# Patient Record
Sex: Male | Born: 1955 | Race: White | Hispanic: No | Smoking: Current every day smoker
Health system: Southern US, Community
[De-identification: ages and names within clinical notes are randomized; demographics above are authoritative.]

## PROBLEM LIST (undated history)

## (undated) DIAGNOSIS — M1990 Unspecified osteoarthritis, unspecified site: Secondary | ICD-10-CM

## (undated) DIAGNOSIS — E785 Hyperlipidemia, unspecified: Secondary | ICD-10-CM

## (undated) HISTORY — PX: NECK SURGERY: SHX720

## (undated) HISTORY — PX: BACK SURGERY: SHX140

---

## 1998-10-22 ENCOUNTER — Ambulatory Visit (HOSPITAL_COMMUNITY): Admission: RE | Admit: 1998-10-22 | Discharge: 1998-10-22 | Payer: Self-pay | Admitting: Neurological Surgery

## 1998-10-22 ENCOUNTER — Encounter: Payer: Self-pay | Admitting: Neurological Surgery

## 1998-11-06 ENCOUNTER — Encounter: Payer: Self-pay | Admitting: Neurological Surgery

## 1998-11-10 ENCOUNTER — Inpatient Hospital Stay (HOSPITAL_COMMUNITY): Admission: RE | Admit: 1998-11-10 | Discharge: 1998-11-10 | Payer: Self-pay | Admitting: Neurological Surgery

## 1998-11-10 ENCOUNTER — Encounter: Payer: Self-pay | Admitting: Neurological Surgery

## 2000-11-23 ENCOUNTER — Ambulatory Visit (HOSPITAL_COMMUNITY): Admission: RE | Admit: 2000-11-23 | Discharge: 2000-11-23 | Payer: Self-pay | Admitting: Neurological Surgery

## 2000-12-14 ENCOUNTER — Ambulatory Visit (HOSPITAL_COMMUNITY): Admission: RE | Admit: 2000-12-14 | Discharge: 2000-12-14 | Payer: Self-pay | Admitting: Neurological Surgery

## 2006-11-02 ENCOUNTER — Ambulatory Visit: Payer: Self-pay | Admitting: Internal Medicine

## 2007-09-28 ENCOUNTER — Emergency Department: Payer: Self-pay | Admitting: Emergency Medicine

## 2009-06-16 ENCOUNTER — Ambulatory Visit: Payer: Self-pay | Admitting: Unknown Physician Specialty

## 2013-01-05 ENCOUNTER — Ambulatory Visit: Payer: Self-pay | Admitting: Internal Medicine

## 2013-05-26 ENCOUNTER — Emergency Department: Payer: Self-pay | Admitting: Emergency Medicine

## 2013-05-26 LAB — GC/CHLAMYDIA PROBE AMP

## 2013-05-26 LAB — URINALYSIS, COMPLETE
Bilirubin,UR: NEGATIVE
Glucose,UR: NEGATIVE mg/dL (ref 0–75)
Ketone: NEGATIVE
Nitrite: POSITIVE
Ph: 6 (ref 4.5–8.0)
Protein: 30
RBC,UR: 12 /HPF (ref 0–5)
Specific Gravity: 1.017 (ref 1.003–1.030)
Squamous Epithelial: NONE SEEN
WBC UR: 1190 /HPF (ref 0–5)

## 2013-05-26 LAB — COMPREHENSIVE METABOLIC PANEL
Albumin: 3.6 g/dL (ref 3.4–5.0)
Alkaline Phosphatase: 98 U/L (ref 50–136)
Anion Gap: 8 (ref 7–16)
BUN: 8 mg/dL (ref 7–18)
Bilirubin,Total: 0.8 mg/dL (ref 0.2–1.0)
Calcium, Total: 9 mg/dL (ref 8.5–10.1)
Chloride: 106 mmol/L (ref 98–107)
Co2: 24 mmol/L (ref 21–32)
Creatinine: 0.94 mg/dL (ref 0.60–1.30)
EGFR (African American): >60
EGFR (Non-African Amer.): >60
Glucose: 105 mg/dL — ABNORMAL HIGH (ref 65–99)
Osmolality: 274 (ref 275–301)
Potassium: 3.4 mmol/L — ABNORMAL LOW (ref 3.5–5.1)
SGOT(AST): 12 U/L — ABNORMAL LOW (ref 15–37)
SGPT (ALT): 12 U/L (ref 12–78)
Sodium: 138 mmol/L (ref 136–145)
Total Protein: 7 g/dL (ref 6.4–8.2)

## 2013-05-26 LAB — CBC
HCT: 36.6 % — ABNORMAL LOW (ref 40.0–52.0)
HGB: 12.9 g/dL — ABNORMAL LOW (ref 13.0–18.0)
MCH: 32.4 pg (ref 26.0–34.0)
MCHC: 35.3 g/dL (ref 32.0–36.0)
MCV: 92 fL (ref 80–100)
Platelet: 259 10*3/uL (ref 150–440)
RBC: 3.98 10*6/uL — ABNORMAL LOW (ref 4.40–5.90)
RDW: 13.4 % (ref 11.5–14.5)
WBC: 17.6 10*3/uL — ABNORMAL HIGH (ref 3.8–10.6)

## 2013-05-27 ENCOUNTER — Emergency Department: Payer: Self-pay | Admitting: Emergency Medicine

## 2013-05-27 LAB — CBC
HCT: 34.7 % — ABNORMAL LOW (ref 40.0–52.0)
HGB: 11.9 g/dL — ABNORMAL LOW (ref 13.0–18.0)
MCH: 32.3 pg (ref 26.0–34.0)
MCHC: 34.4 g/dL (ref 32.0–36.0)
Platelet: 210 10*3/uL (ref 150–440)
RDW: 13.4 % (ref 11.5–14.5)
WBC: 16.2 10*3/uL — ABNORMAL HIGH (ref 3.8–10.6)

## 2013-05-28 LAB — URINE CULTURE

## 2013-05-31 LAB — CULTURE, BLOOD (SINGLE)

## 2013-12-29 ENCOUNTER — Emergency Department: Payer: Self-pay | Admitting: Emergency Medicine

## 2015-12-03 ENCOUNTER — Other Ambulatory Visit: Payer: Self-pay | Admitting: Internal Medicine

## 2015-12-03 DIAGNOSIS — M5416 Radiculopathy, lumbar region: Secondary | ICD-10-CM

## 2015-12-24 ENCOUNTER — Ambulatory Visit
Admission: RE | Admit: 2015-12-24 | Discharge: 2015-12-24 | Disposition: A | Discharge status: Home / Self Care | Payer: Medicare Other | Source: Ambulatory Visit | Attending: Internal Medicine | Admitting: Internal Medicine

## 2015-12-24 DIAGNOSIS — Z9889 Other specified postprocedural states: Secondary | ICD-10-CM | POA: Diagnosis not present

## 2015-12-24 DIAGNOSIS — R937 Abnormal findings on diagnostic imaging of other parts of musculoskeletal system: Secondary | ICD-10-CM | POA: Diagnosis not present

## 2015-12-24 DIAGNOSIS — M5416 Radiculopathy, lumbar region: Secondary | ICD-10-CM | POA: Diagnosis not present

## 2016-01-07 ENCOUNTER — Emergency Department
Admission: EM | Admit: 2016-01-07 | Discharge: 2016-01-07 | Disposition: A | Discharge status: Home / Self Care | Payer: Medicare Other | Attending: Emergency Medicine | Admitting: Emergency Medicine

## 2016-01-07 ENCOUNTER — Encounter: Payer: Self-pay | Admitting: *Deleted

## 2016-01-07 DIAGNOSIS — Y999 Unspecified external cause status: Secondary | ICD-10-CM | POA: Diagnosis not present

## 2016-01-07 DIAGNOSIS — Z4800 Encounter for change or removal of nonsurgical wound dressing: Secondary | ICD-10-CM | POA: Diagnosis present

## 2016-01-07 DIAGNOSIS — F17210 Nicotine dependence, cigarettes, uncomplicated: Secondary | ICD-10-CM | POA: Insufficient documentation

## 2016-01-07 DIAGNOSIS — Y929 Unspecified place or not applicable: Secondary | ICD-10-CM | POA: Insufficient documentation

## 2016-01-07 DIAGNOSIS — W228XXA Striking against or struck by other objects, initial encounter: Secondary | ICD-10-CM | POA: Diagnosis not present

## 2016-01-07 DIAGNOSIS — M1990 Unspecified osteoarthritis, unspecified site: Secondary | ICD-10-CM | POA: Insufficient documentation

## 2016-01-07 DIAGNOSIS — Y9389 Activity, other specified: Secondary | ICD-10-CM | POA: Insufficient documentation

## 2016-01-07 DIAGNOSIS — S61411A Laceration without foreign body of right hand, initial encounter: Secondary | ICD-10-CM | POA: Diagnosis not present

## 2016-01-07 HISTORY — DX: Unspecified osteoarthritis, unspecified site: M19.90
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
## 2016-01-07 NOTE — Discharge Instructions (Signed)
Leave dressing on for 1 week. Keep area clean and dry. Tylenol if needed for pain. Follow-up with your primary care doctor if any continued problems.

## 2016-01-07 NOTE — ED Notes (Signed)
Pt has  A skin tear on right hand and would like wound checked
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
## 2016-05-27 ENCOUNTER — Emergency Department: Payer: Medicare Other

## 2016-05-27 ENCOUNTER — Emergency Department
Admission: EM | Admit: 2016-05-27 | Discharge: 2016-05-27 | Disposition: A | Discharge status: Home / Self Care | Payer: Medicare Other | Attending: Emergency Medicine | Admitting: Emergency Medicine

## 2016-05-27 DIAGNOSIS — S161XXA Strain of muscle, fascia and tendon at neck level, initial encounter: Secondary | ICD-10-CM | POA: Insufficient documentation

## 2016-05-27 DIAGNOSIS — S20211A Contusion of right front wall of thorax, initial encounter: Secondary | ICD-10-CM | POA: Insufficient documentation

## 2016-05-27 DIAGNOSIS — Y9389 Activity, other specified: Secondary | ICD-10-CM | POA: Insufficient documentation

## 2016-05-27 DIAGNOSIS — F17210 Nicotine dependence, cigarettes, uncomplicated: Secondary | ICD-10-CM | POA: Diagnosis not present

## 2016-05-27 DIAGNOSIS — Y999 Unspecified external cause status: Secondary | ICD-10-CM | POA: Insufficient documentation

## 2016-05-27 DIAGNOSIS — S39012A Strain of muscle, fascia and tendon of lower back, initial encounter: Secondary | ICD-10-CM | POA: Diagnosis not present

## 2016-05-27 DIAGNOSIS — S199XXA Unspecified injury of neck, initial encounter: Secondary | ICD-10-CM | POA: Diagnosis present

## 2016-05-27 DIAGNOSIS — Y92410 Unspecified street and highway as the place of occurrence of the external cause: Secondary | ICD-10-CM | POA: Diagnosis not present

## 2016-05-27 MED ORDER — OXYCODONE-ACETAMINOPHEN 5-325 MG PO TABS
1.0000 | ORAL_TABLET | Freq: Once | ORAL | Status: AC
  Administered 2016-05-27: 1 via ORAL
  Filled 2016-05-27: qty 1

## 2016-05-27 MED ORDER — ETODOLAC 500 MG PO TABS: 500.0000 mg | ORAL_TABLET | Freq: Two times a day (BID) | ORAL | 0 refills | Status: DC

## 2016-05-27 MED ORDER — CYCLOBENZAPRINE HCL 10 MG PO TABS: 10.0000 mg | ORAL_TABLET | Freq: Three times a day (TID) | ORAL | 0 refills | Status: DC | PRN
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
## 2016-05-27 NOTE — Discharge Instructions (Signed)
Take pain medicine as directed. Follow-up with your physician if not improving. Return to the emergency room for any concerns.

## 2016-06-03 ENCOUNTER — Emergency Department: Payer: Medicare Other

## 2016-06-03 ENCOUNTER — Emergency Department
Admission: EM | Admit: 2016-06-03 | Discharge: 2016-06-03 | Disposition: A | Discharge status: Home / Self Care | Payer: Medicare Other | Attending: Emergency Medicine | Admitting: Emergency Medicine

## 2016-06-03 DIAGNOSIS — S2241XA Multiple fractures of ribs, right side, initial encounter for closed fracture: Secondary | ICD-10-CM | POA: Diagnosis not present

## 2016-06-03 DIAGNOSIS — Y92410 Unspecified street and highway as the place of occurrence of the external cause: Secondary | ICD-10-CM | POA: Diagnosis not present

## 2016-06-03 DIAGNOSIS — F17210 Nicotine dependence, cigarettes, uncomplicated: Secondary | ICD-10-CM | POA: Insufficient documentation

## 2016-06-03 DIAGNOSIS — Y9389 Activity, other specified: Secondary | ICD-10-CM | POA: Diagnosis not present

## 2016-06-03 DIAGNOSIS — S2231XA Fracture of one rib, right side, initial encounter for closed fracture: Secondary | ICD-10-CM

## 2016-06-03 DIAGNOSIS — R0602 Shortness of breath: Secondary | ICD-10-CM | POA: Diagnosis present

## 2016-06-03 DIAGNOSIS — Y999 Unspecified external cause status: Secondary | ICD-10-CM | POA: Diagnosis not present

## 2016-06-03 HISTORY — DX: Hyperlipidemia, unspecified: E78.5

## 2016-06-03 LAB — CBC
HCT: 36.7 % — ABNORMAL LOW (ref 40.0–52.0)
Hemoglobin: 13 g/dL (ref 13.0–18.0)
MCH: 35.3 pg — ABNORMAL HIGH (ref 26.0–34.0)
MCHC: 35.3 g/dL (ref 32.0–36.0)
MCV: 100.1 fL — AB (ref 80.0–100.0)
PLATELETS: 313 10*3/uL (ref 150–440)
RBC: 3.67 MIL/uL — AB (ref 4.40–5.90)
RDW: 14.6 % — AB (ref 11.5–14.5)
WBC: 9.2 10*3/uL (ref 3.8–10.6)

## 2016-06-03 LAB — BASIC METABOLIC PANEL
ANION GAP: 7 (ref 5–15)
CO2: 28 mmol/L (ref 22–32)
Calcium: 9.6 mg/dL (ref 8.9–10.3)
Chloride: 100 mmol/L — ABNORMAL LOW (ref 101–111)
Creatinine, Ser: 0.78 mg/dL (ref 0.61–1.24)
Glucose, Bld: 83 mg/dL (ref 65–99)
POTASSIUM: 3.6 mmol/L (ref 3.5–5.1)
SODIUM: 135 mmol/L (ref 135–145)

## 2016-06-03 LAB — TROPONIN I

## 2016-06-03 MED ORDER — ONDANSETRON HCL 4 MG/2ML IJ SOLN
4.0000 mg | Freq: Once | INTRAMUSCULAR | Status: AC
  Administered 2016-06-03: 4 mg via INTRAVENOUS
  Filled 2016-06-03: qty 2

## 2016-06-03 MED ORDER — MORPHINE SULFATE (PF) 4 MG/ML IV SOLN
4.0000 mg | Freq: Once | INTRAVENOUS | Status: AC
  Administered 2016-06-03: 4 mg via INTRAVENOUS
  Filled 2016-06-03: qty 1

## 2016-06-03 MED ORDER — IOPAMIDOL (ISOVUE-370) INJECTION 76%
125.0000 mL | Freq: Once | INTRAVENOUS | Status: AC | PRN
  Administered 2016-06-03: 125 mL via INTRAVENOUS

## 2016-06-03 MED ORDER — SODIUM CHLORIDE 0.9 % IV BOLUS (SEPSIS)
1000.0000 mL | Freq: Once | INTRAVENOUS | Status: AC
  Administered 2016-06-03: 1000 mL via INTRAVENOUS

## 2016-06-03 NOTE — Discharge Instructions (Signed)
1. Continue pain medicine as prescribed by your doctor. 2. Use incentive spirometer as instructed. 3. Return to the ER for worsening symptoms, fever, persistent vomiting, difficulty breathing or other concerns.

## 2016-06-03 NOTE — ED Notes (Signed)
Patient transported to CT 

## 2016-06-03 NOTE — ED Notes (Signed)
Pt discharged to home.  Family member driving.  Discharge instructions reviewed.  Verbalized understanding.  No questions or concerns at this time.  Teach back verified.  Pt in NAD.  No items left in ED.   
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
## 2019-08-28 ENCOUNTER — Encounter: Payer: Self-pay | Admitting: Student in an Organized Health Care Education/Training Program

## 2019-08-28 NOTE — Progress Notes (Signed)
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 patient will need baseline urine  toxicology screen and referral to pain psychiatry for risk evaluation which is customary for new patients.  In the meantime opioid management to continue with PCP.  5.  Patient has tried and failed gabapentin in the past resulting in sedation.  Has tried various NSAIDs in the past including ibuprofen and Aleve which he states were not effective and caused GI upset.  He has tried extra strength Tylenol in the past as well.  He utilizes hydrocodone 2 tablets every 8 hours which he states provides suboptimal pain relief.  We had extensive discussion about opioid-induced hyperalgesia and tolerance.  We also discussed how chronic opioid therapy can decrease testosterone levels.  Upon my assessment, no red flags the patient's past medical history to suggest high risk.  Could  consider alternatives such as buprenorphine therapy.  Patient states that opioid medications do help with his pain to a certain extent and help him function.     Lab Orders     UDS (Comprehensive-24) ([facility]) ([facility]) (New Pt.)  Imaging Orders     MRI C-Spine w/o contrast     MR Lumbar Spine w/o contrast  Referral Orders     SUD Evaluation (Med.Psych.) Pharmacotherapy (current): Medications ordered:  Meds ordered this encounter  Medications  . pregabalin (LYRICA) 50 MG capsule    Sig: Take 1 capsule (50 mg total) by mouth at bedtime for 30 days, THEN 1 capsule (50 mg total) 2 (two) times daily.    Dispense:  150 capsule    Refill:  0   Medications administered during this visit: Thaniel Florestine Martin had no medications administered during this visit.   Pharmacological management options:  Opioid Analgesics: The patient was informed that there is no guarantee that he would be a candidate for opioid analgesics. The decision will be made following CDC guidelines. This decision will be based on the results of diagnostic studies, as well as Mr. Berenson risk profile.   Membrane stabilizer: Lyrica as above. Tried and failed Gabapentin (sedation). Consider cymbalta  Muscle relaxant: To be determined at a later time  NSAID: To be determined at a later time  Other analgesic(s): To be determined at a later time   Interventional management options: Mr. Stammer was informed that there is no guarantee that he would be a candidate for interventional therapies. The decision will be based on the results of diagnostic studies, as well as Mr. Marinello risk profile.  Procedure(s) under consideration:  Pending C-MRI and L-MRI   Provider-requested follow-up: Return in about 6 weeks (around 10/10/2019) for After Imaging, After Psychological evaluation.  No future appointments.  Total duration of non-face-to-face encounter: 45 minutes.  Primary Care Physician: Rusty Aus, MD Location: [facility] Note by: Gillis Santa, MD Date: 08/29/2019; Time: 1:36 PM  Note: This dictation was prepared with Dragon dictation. Any transcriptional errors that may result from this process are unintentional.

## 2019-08-28 NOTE — Progress Notes (Signed)
New patient coming for chronic back pain.  Has had a neck surgery and back surgery that helped initially.  States that his back bones are crumbling. Diables since 2016

## 2019-08-29 ENCOUNTER — Ambulatory Visit
Payer: Medicare Other | Attending: Student in an Organized Health Care Education/Training Program | Admitting: Student in an Organized Health Care Education/Training Program

## 2019-08-29 ENCOUNTER — Other Ambulatory Visit: Payer: Self-pay

## 2019-08-29 ENCOUNTER — Encounter: Payer: Self-pay | Admitting: Student in an Organized Health Care Education/Training Program

## 2019-08-29 DIAGNOSIS — M5412 Radiculopathy, cervical region: Secondary | ICD-10-CM

## 2019-08-29 DIAGNOSIS — Z79891 Long term (current) use of opiate analgesic: Secondary | ICD-10-CM

## 2019-08-29 DIAGNOSIS — Z981 Arthrodesis status: Secondary | ICD-10-CM | POA: Diagnosis not present

## 2019-08-29 DIAGNOSIS — M542 Cervicalgia: Secondary | ICD-10-CM

## 2019-08-29 DIAGNOSIS — G894 Chronic pain syndrome: Secondary | ICD-10-CM

## 2019-08-29 DIAGNOSIS — F1120 Opioid dependence, uncomplicated: Secondary | ICD-10-CM

## 2019-08-29 DIAGNOSIS — M5416 Radiculopathy, lumbar region: Secondary | ICD-10-CM

## 2019-08-29 DIAGNOSIS — G8929 Other chronic pain: Secondary | ICD-10-CM

## 2019-08-29 DIAGNOSIS — M47812 Spondylosis without myelopathy or radiculopathy, cervical region: Secondary | ICD-10-CM | POA: Diagnosis not present

## 2019-08-29 DIAGNOSIS — M5136 Other intervertebral disc degeneration, lumbar region: Secondary | ICD-10-CM

## 2019-08-29 DIAGNOSIS — M51369 Other intervertebral disc degeneration, lumbar region without mention of lumbar back pain or lower extremity pain: Secondary | ICD-10-CM | POA: Insufficient documentation

## 2019-08-29 MED ORDER — PREGABALIN 50 MG PO CAPS: ORAL_CAPSULE | ORAL | 0 refills | Status: DC

## 2019-09-07 LAB — COMPLIANCE DRUG ANALYSIS, UR

## 2019-09-12 ENCOUNTER — Ambulatory Visit: Payer: Medicare Other | Admitting: Student in an Organized Health Care Education/Training Program

## 2019-09-17 ENCOUNTER — Other Ambulatory Visit: Payer: Self-pay

## 2019-09-17 ENCOUNTER — Ambulatory Visit
Admission: RE | Admit: 2019-09-17 | Discharge: 2019-09-17 | Disposition: A | Discharge status: Home / Self Care | Payer: Medicare Other | Source: Ambulatory Visit | Attending: Student in an Organized Health Care Education/Training Program | Admitting: Student in an Organized Health Care Education/Training Program

## 2019-09-17 DIAGNOSIS — Z981 Arthrodesis status: Secondary | ICD-10-CM | POA: Insufficient documentation

## 2019-09-17 DIAGNOSIS — M5416 Radiculopathy, lumbar region: Secondary | ICD-10-CM | POA: Insufficient documentation

## 2019-09-17 DIAGNOSIS — M5412 Radiculopathy, cervical region: Secondary | ICD-10-CM | POA: Insufficient documentation

## 2019-09-24 ENCOUNTER — Telehealth: Payer: Self-pay

## 2019-09-24 NOTE — Telephone Encounter (Signed)
Attempted to call patient .  Message came on that states that the patient can not be reached at this time.

## 2019-09-25 ENCOUNTER — Encounter: Payer: Self-pay | Admitting: Student in an Organized Health Care Education/Training Program

## 2019-09-25 NOTE — Progress Notes (Signed)
Today's visit is to review MRI results for back. Patient was started on Lyrica last appointment and states he had to stop taking it after 12 days. It made him constipated and very evil. He weaned off his medication.

## 2019-09-28 ENCOUNTER — Encounter

## 2019-09-28 ENCOUNTER — Ambulatory Visit (HOSPITAL_COMMUNITY): Payer: Medicaid Other | Admitting: Psychiatry

## 2019-09-30 ENCOUNTER — Encounter: Payer: Self-pay | Admitting: Student in an Organized Health Care Education/Training Program

## 2019-09-30 ENCOUNTER — Other Ambulatory Visit: Payer: Self-pay

## 2019-09-30 ENCOUNTER — Ambulatory Visit
Payer: Medicare Other | Attending: Student in an Organized Health Care Education/Training Program | Admitting: Student in an Organized Health Care Education/Training Program

## 2019-09-30 DIAGNOSIS — G894 Chronic pain syndrome: Secondary | ICD-10-CM

## 2019-09-30 DIAGNOSIS — Z79891 Long term (current) use of opiate analgesic: Secondary | ICD-10-CM

## 2019-09-30 DIAGNOSIS — G8929 Other chronic pain: Secondary | ICD-10-CM

## 2019-09-30 DIAGNOSIS — Z9889 Other specified postprocedural states: Secondary | ICD-10-CM

## 2019-09-30 DIAGNOSIS — M5412 Radiculopathy, cervical region: Secondary | ICD-10-CM

## 2019-09-30 DIAGNOSIS — M5416 Radiculopathy, lumbar region: Secondary | ICD-10-CM | POA: Diagnosis not present

## 2019-09-30 DIAGNOSIS — M47812 Spondylosis without myelopathy or radiculopathy, cervical region: Secondary | ICD-10-CM

## 2019-09-30 DIAGNOSIS — M47816 Spondylosis without myelopathy or radiculopathy, lumbar region: Secondary | ICD-10-CM

## 2019-09-30 DIAGNOSIS — Z981 Arthrodesis status: Secondary | ICD-10-CM

## 2019-09-30 DIAGNOSIS — F1120 Opioid dependence, uncomplicated: Secondary | ICD-10-CM

## 2019-09-30 MED ORDER — BUPRENORPHINE 7.5 MCG/HR TD PTWK: 7.5000 ug/h | MEDICATED_PATCH | TRANSDERMAL | 0 refills | Status: AC

## 2019-09-30 NOTE — Progress Notes (Signed)
Virtual Encounter - Pain Management     Contact & Pharmacy Preferred: [phone] Home: [phone] (home) Mobile: [phone] (mobile) E-mail: [email or web address]  [facility] [address] [place], [place] - [address] [place] [address] Phone: [phone] Fax: [phone]  [id number] [place], [place] - [address] AT [facility] OF [address] [place] [address] [place] [address] Phone: [phone] Fax: [phone]   Pre-screening  Mr. Stash offered "in-person" vs "virtual" encounter. He indicated preferring virtual for this encounter.   Reason COVID-19*  Social distancing based on CDC and AMA recommendations.   I contacted Auron Vaughan Sine on 09/30/2019 via telephone (attempted video conference but patient unable to connect).      I clearly identified myself as Edward Jolly, MD. I verified that I was speaking with the correct person using two identifiers (Name: Suzie Portela, and date of birth: 25-Jul-1956).  Consent I sought verbal advanced consent from Suzie Portela for virtual visit interactions. I informed Mr. Ganser of possible security and privacy concerns, risks, and limitations associated with providing "not-in-person" medical evaluation and management services. I also informed Mr. Blasius of the availability of "in-person" appointments. Finally, I informed him that there would be a charge for the virtual visit and that he could be  personally, fully or partially, financially responsible for it. Mr. Eisner expressed understanding and agreed to proceed.   Pt location: home address (see Epic) Provider location: office  Historic Elements   Mr. ASAD KEEVEN is a 64 y.o. year old, male patient evaluated today after his last encounter by our practice on 09/24/2019. Mr. Thackston  has a past medical history of Arthritis and Hyperlipidemia. He also  has a past surgical history that includes Neck surgery and Back surgery. Mr. Nielson has a  current medication list which includes the following prescription(s): acetaminophen, aspirin-acetaminophen-caffeine, vitamin b12, folic acid, hydrocodone-acetaminophen, simvastatin, and buprenorphine. He  reports that he has been smoking cigarettes. He has a 15.00 pack-year smoking history. He has never used smokeless tobacco. He reports that he does not drink alcohol. No history on file for drug. Mr. Coll is allergic to lyrica [pregabalin]; penicillins; and prednisone.   HPI  Today, he is being contacted for review MRI and discuss treatment plan   Tried Lyrica did not sit well with patient, had difficulty controlling temper. Weaned himself off. L-MRI and C-MRI completed with results below. Was scheduled to have appointment with Psych on 1/2 but was rescheduled Goals of pain management include improvement in quality of life and maintenance of functional status without escalation of opioid medications. Patient is prescribed hydrocodone 7.5 mg quantity 180 and usually takes 2 tablets at once 3 times a day.  He states that his current prescription usually lasts about 2-2 and half weeks.  Pharmacotherapy Assessment  Analgesic:  09/12/2019  1   09/12/2019  Hydrocodone-Acetamin 7.5-325  180.00  30 Ma Mil   [id number]   Wal ([id number])   0  45.00 MME  Medicare   [place]    Monitoring: Pharmacotherapy: No side-effects or adverse reactions reported. [place] PMP: PDMP reviewed during this encounter.       Compliance: No problems identified. Effectiveness: patient likely experiencing opioid tolerance and would recommend opioid rotation and or holiday.  Patient is more interested in opioid rotation.  Discussed long-acting therapy as below. Plan: Refer to "POC".  UDS:  Summary  Date Value Ref Range Status  09/02/2019  Note  Final    Comment:    ==================================================================== Compliance Drug Analysis, Ur ==================================================================== Test                              Result       Flag       Units Drug Present and Declared for Prescription Verification   Pregabalin                     PRESENT      EXPECTED   Acetaminophen                  PRESENT      EXPECTED Drug Absent but Declared for Prescription Verification   Hydrocodone                    Not Detected UNEXPECTED ng/mg creat ==================================================================== Test                      Result    Flag   Units      Ref Range   Creatinine              59               mg/dL      >=30>=20 ==================================================================== Declared Medications:  The flagging and interpretation on this report are based on the  following declared medications.  Unexpected results may arise from  inaccuracies in the declared medications.  **Note: The testing scope of this panel includes these medications:  Hydrocodone  Pregabalin  **Note: The testing scope of this panel does not include small to  moderate amounts of these reported medications:  Acetaminophen  **Note: The testing scope of this panel does not include the  following reported medications:  Folic Acid  Simvastatin  Vitamin B12 ==================================================================== For clinical consultation, please call [phone]. ====================================================================    Laboratory Chemistry Profile (12 mo)  Renal: No results found for requested labs within last 8760 hours.  Lab Results  Component Value Date   GFRAA >60 06/03/2016   GFRNONAA >60 06/03/2016   Hepatic: No results found for requested labs within last 8760 hours. Lab Results  Component Value Date   AST 12 (L) 05/26/2013   ALT 12 05/26/2013   Other: No results found for requested labs within last 8760 hours. Note: Above Lab results reviewed.  Imaging  MRI C-Spine w/o contrast CLINICAL DATA:  Neck pain and radiculitis.  Cervical spine  fusion.  EXAM: MRI CERVICAL SPINE WITHOUT CONTRAST  TECHNIQUE: Multiplanar, multisequence MR imaging of the cervical spine was performed. No intravenous contrast was administered.  COMPARISON:  MRI cervical spine 01/05/2013  FINDINGS: Alignment: 3 mm retrolisthesis C3-4 is progressed. 2 mm retrolisthesis C4-5 unchanged. Remaining alignment normal.  Vertebrae: ACDF C5-6 and C6-7.  Negative for fracture or mass.  Cord: Normal signal and morphology.  Posterior Fossa, vertebral arteries, paraspinal tissues: Negative  Disc levels:  C2-3: Mild facet degeneration bilaterally. Negative for stenosis  C3-4: 3 mm retrolisthesis with disc degeneration and spurring. Cord flattening with mild to moderate spinal stenosis which has progressed. Moderate foraminal stenosis bilaterally due to spurring  C4-5: 2 mm retrolisthesis. Disc degeneration and diffuse uncinate spurring. Moderate spinal stenosis and moderate foraminal stenosis bilaterally due to spurring. Stenosis has progressed in the interval.  C5-6: ACDF without stenosis.  C6-7: ACDF without stenosis  C7-T1: Small central disc protrusion. Negative for  spinal or foraminal stenosis. No change from the prior study.  IMPRESSION: Progressive spondylosis C3-4 and C4-5. Progressive spinal and foraminal stenosis at both levels compared with 2014  ACDF C5-6 and C6-7 without stenosis.  Electronically Signed   By: Franchot Gallo M.D.   On: 09/17/2019 13:46 MR Lumbar Spine w/o contrast CLINICAL DATA:  Lumbar radiculopathy.  EXAM: MRI LUMBAR SPINE WITHOUT CONTRAST  TECHNIQUE: Multiplanar, multisequence MR imaging of the lumbar spine was performed. No intravenous contrast was administered.  COMPARISON:  Lumbar MRI 12/24/2015  FINDINGS: Segmentation: Normal segmentation. S1 is a transitional vertebra. Prior surgery on the left at L5-S1. Numbering is consistent with the prior MRI.  Alignment:  Normal  Vertebrae: Negative  for fracture or mass. Normal bone marrow. Discogenic edema on the left at L5-S1.  Conus medullaris and cauda equina: Conus extends to the L1 level. Conus and cauda equina appear normal.  Paraspinal and other soft tissues: Negative for paraspinous mass or adenopathy.  Disc levels:  T12-L1: Small right paracentral disc protrusion, unchanged.  L1-2: Mild disc and mild facet degeneration.  Negative for stenosis  L2-3: Mild disc and mild facet degeneration.  Negative for stenosis  L3-4: Disc bulging and facet degeneration.  No significant stenosis  L4-5: Mild disc and facet degeneration.  Negative for stenosis  L5-S1: Postop laminectomy on the left. Advanced disc degeneration with disc space narrowing and spurring left greater than right. Discogenic edema in the endplates on the left. Mild endplate spurring on the left causing mild subarticular stenosis on the left. No change from the prior study.  IMPRESSION: Prior laminectomy on the left L5-S1. Persistent disc degeneration and scarring on the left with mild subarticular stenosis, similar to 2017. No acute disc protrusion  Mild degenerative changes elsewhere in the spine without spinal stenosis.  Electronically Signed   By: Franchot Gallo M.D.   On: 09/17/2019 13:42   Assessment   1. Cervical facet joint syndrome   2. Cervical spondylosis   3. Cervical radicular pain   4. Hx of decompressive lumbar laminectomy (left L5/S1)   5. S/P cervical spinal fusion (c5-7 1998)   6. Chronic radicular lumbar pain   7. Lumbar spondylosis   8. Lumbar facet arthropathy   9. Long term prescription opiate use   10. Opiate dependence, continuous (HCC)   11. Chronic pain syndrome     Plan of Care   Spent an extensive period of time reviewing the patient's cervical and lumbar spine MRI.  In regards to the patient's neck and bilateral shoulder pain, he does have a history of C4-C5 and C5-C6 ACDF.  Cervical MRI shows cervical  degenerative disease and cervical spondylosis.  We discussed diagnostic cervical facet medial branch nerve blocks at C3, C4, C5, C6.  Risks and benefits of this procedure were reviewed.  We also briefly discussed therapeutic radiofrequency ablation diagnostic facet medial branch nerve blocks are effective.  Patient states that he will think about this and let us know if he wants to proceed.  Also reviewed the patient's lumbar spine MRI.  Largely unchanged from his previous lumbar spine MRI other than advanced lumbar degenerative changes.  No significant spinal canal stenosis or neuroforaminal stenosis.  Patient does have a history of left L5-S1 laminectomy which is stable.  We discussed diagnostic lumbar facet medial branch nerve blocks for his low back and buttock pain.  Risks and benefits were reviewed and patient states that he will think about this further.  In regards to medication management, patient  has tried gabapentin in the past which was not effective and resulted in side effects.  He states that NSAIDs caused him to have GI upset.  Recent trial of Lyrica was not successful and worsen the patient's temperament and he has since discontinued this medication.  Patient is on chronic opioid therapy with hydrocodone 7.5 mg, quantity 180/month prescribed by Dr. Hyacinth Meeker his PCP.  Patient states that he usually takes 1 to 2 tablets up to 3 times a day.  I explained to the patient that he is likely experiencing tolerance and that opioid rotation and/or opioid wean may help his overall pain.  It may be reasonable for the patient to be on a long-acting milder opioid analgesic such as buprenorphine which has much less side effect potential than your direct acting opioid analgesics.  Patient states that he only gets approximately 2 to 3 hours of pain relief with hydrocodone.  I am hoping that with buprenorphine therapy Mr. Broker hopefully be able to wean his hydrocodone.  For the time being, I will have the patient  continue his hydrocodone with his primary care provider.  He is still awaiting psychiatric evaluation which for some reason was canceled on 09/27/2018 and patient is still waiting to reschedule.  We discussed initiating buprenorphine at a dose of 7.5 mcg.  I discussed application sites with the patient and also reviewed that he has to alternate the site every week.  Patient can continue hydrocodone while he is on his buprenorphine patch but if he is obtaining analgesic benefit from buprenorphine, at his next visit, goal will be to titrate his dose and wean his hydrocodone.  If patient has completed his psychiatric assessment which is customary for new patients by that time, I can consider taking over his hydrocodone.  In the meantime, prescription of hydrocodone to continue with PCP Dr. Hyacinth Meeker.  I have discontinued Authur A. Gomillion's pregabalin. I am also having him start on Buprenorphine. Additionally, I am having him maintain his HYDROcodone-acetaminophen, folic acid, simvastatin, Vitamin B12, aspirin-acetaminophen-caffeine, and acetaminophen.  Pharmacotherapy (Medications Ordered): Meds ordered this encounter  Medications  . Buprenorphine 7.5 MCG/HR PTWK    Sig: Place 7.5 mcg/hr onto the skin every 7 (seven) days. For chronic pain syndrome.    Dispense:  4 patch    Refill:  0    Do not place this medication, or any other prescription from our practice, on "Automatic Refill". Patient may have prescription filled one day early if pharmacy is closed on scheduled refill date.   Orders:  Orders Placed This Encounter  Procedures  . C-FCT Blk (PRN)    CLINICAL INDICATIONS: Neck pain. Indications: Cervical Facet Syndrome.    Standing Status:   Standing    Number of Occurrences:   2    Standing Expiration Date:   03/29/2021    Scheduling Instructions:     LATERALITY: TBD     Level: C3-4, C4-5, C5-6 Facet joints (C3, C4, C5, C6,)     SEDATION: Patient's choice.     TIMEFRAME: PRN procedure. (Mr. Recore will  call when needed.)    Order Specific Question:   Where will this procedure be performed?    Answer:   [facility]   Follow-up plan:   Return in about 4 weeks (around 10/28/2019) for Medication Management, virtual.    Recent Visits Date Type Provider Dept  08/29/19 Office Visit Edward Jolly, MD [facility]  Showing recent visits within past 90 days and meeting all other requirements  Today's Visits Date Type Provider Dept  09/30/19 Office Visit Edward Jolly, MD [facility]  Showing today's visits and meeting all other requirements   Future Appointments No visits were found meeting these conditions.  Showing future appointments within next 90 days and meeting all other requirements   I discussed the assessment and treatment plan with the patient. The patient was provided an opportunity to ask questions and all were answered. The patient agreed with the plan and demonstrated an understanding of the instructions.  Patient advised to call back or seek an in-person evaluation if the symptoms or condition worsens.  Total duration of non-face-to-face encounter: 45 minutes.  Note by: Edward Jolly, MD Date: 09/30/2019; Time: 3:21 PM

## 2019-10-07 ENCOUNTER — Telehealth: Payer: Self-pay | Admitting: Student in an Organized Health Care Education/Training Program

## 2019-10-07 NOTE — Telephone Encounter (Signed)
Pt called and was very angry and rude. He stated that he did not know that his insurance was an HMO and did not cover his visits or medication through here. He stated that we "can shove it all up our butts". He also stated that he is tired of being accused of selling or inappropriately using his medication and that the one medication he knows works for him, Dr Cherylann Ratel wouldn't prescribe. He stated that he is done with this clinic and would not be coming back.

## 2019-10-28 ENCOUNTER — Encounter: Payer: Medicare HMO | Admitting: Student in an Organized Health Care Education/Training Program

## 2021-04-21 ENCOUNTER — Other Ambulatory Visit: Payer: Self-pay

## 2021-04-21 ENCOUNTER — Inpatient Hospital Stay
Admission: EM | Admit: 2021-04-21 | Discharge: 2021-04-24 | DRG: 872 | Disposition: A | Discharge status: Home / Self Care | Payer: Medicare Other | Attending: Internal Medicine | Admitting: Internal Medicine

## 2021-04-21 ENCOUNTER — Emergency Department: Payer: Medicare Other

## 2021-04-21 DIAGNOSIS — Z79899 Other long term (current) drug therapy: Secondary | ICD-10-CM

## 2021-04-21 DIAGNOSIS — M5416 Radiculopathy, lumbar region: Secondary | ICD-10-CM | POA: Diagnosis present

## 2021-04-21 DIAGNOSIS — G894 Chronic pain syndrome: Secondary | ICD-10-CM | POA: Diagnosis present

## 2021-04-21 DIAGNOSIS — M5412 Radiculopathy, cervical region: Secondary | ICD-10-CM | POA: Diagnosis present

## 2021-04-21 DIAGNOSIS — E785 Hyperlipidemia, unspecified: Secondary | ICD-10-CM | POA: Diagnosis present

## 2021-04-21 DIAGNOSIS — Z825 Family history of asthma and other chronic lower respiratory diseases: Secondary | ICD-10-CM | POA: Diagnosis not present

## 2021-04-21 DIAGNOSIS — Z79891 Long term (current) use of opiate analgesic: Secondary | ICD-10-CM

## 2021-04-21 DIAGNOSIS — Z20822 Contact with and (suspected) exposure to covid-19: Secondary | ICD-10-CM | POA: Diagnosis present

## 2021-04-21 DIAGNOSIS — M1990 Unspecified osteoarthritis, unspecified site: Secondary | ICD-10-CM | POA: Diagnosis present

## 2021-04-21 DIAGNOSIS — N12 Tubulo-interstitial nephritis, not specified as acute or chronic: Secondary | ICD-10-CM | POA: Diagnosis present

## 2021-04-21 DIAGNOSIS — G43909 Migraine, unspecified, not intractable, without status migrainosus: Secondary | ICD-10-CM | POA: Diagnosis present

## 2021-04-21 DIAGNOSIS — E872 Acidosis: Secondary | ICD-10-CM | POA: Diagnosis present

## 2021-04-21 DIAGNOSIS — A4151 Sepsis due to Escherichia coli [E. coli]: Principal | ICD-10-CM | POA: Diagnosis present

## 2021-04-21 DIAGNOSIS — Z888 Allergy status to other drugs, medicaments and biological substances status: Secondary | ICD-10-CM

## 2021-04-21 DIAGNOSIS — A419 Sepsis, unspecified organism: Secondary | ICD-10-CM

## 2021-04-21 DIAGNOSIS — N10 Acute tubulo-interstitial nephritis: Secondary | ICD-10-CM | POA: Diagnosis present

## 2021-04-21 DIAGNOSIS — F17210 Nicotine dependence, cigarettes, uncomplicated: Secondary | ICD-10-CM | POA: Diagnosis present

## 2021-04-21 DIAGNOSIS — Z981 Arthrodesis status: Secondary | ICD-10-CM

## 2021-04-21 DIAGNOSIS — Z880 Allergy status to penicillin: Secondary | ICD-10-CM

## 2021-04-21 LAB — CREATININE, SERUM
Creatinine, Ser: 1.23 mg/dL (ref 0.61–1.24)
GFR, Estimated: >60 mL/min (ref >60)

## 2021-04-21 LAB — PROTIME-INR
INR: 1 (ref 0.8–1.2)
Prothrombin Time: 13.6 seconds (ref 11.4–15.2)

## 2021-04-21 LAB — APTT: aPTT: 35 seconds (ref 24–36)

## 2021-04-21 LAB — COMPREHENSIVE METABOLIC PANEL
ALT: 7 U/L (ref 0–44)
AST: 19 U/L (ref 15–41)
Albumin: 3.5 g/dL (ref 3.5–5.0)
Alkaline Phosphatase: 85 U/L (ref 38–126)
Anion gap: 11 (ref 5–15)
BUN: 9 mg/dL (ref 8–23)
CO2: 21 mmol/L — ABNORMAL LOW (ref 22–32)
Calcium: 9.1 mg/dL (ref 8.9–10.3)
Chloride: 101 mmol/L (ref 98–111)
Creatinine, Ser: 0.95 mg/dL (ref 0.61–1.24)
GFR, Estimated: >60 mL/min (ref >60)
Glucose, Bld: 123 mg/dL — ABNORMAL HIGH (ref 70–99)
Potassium: 3.9 mmol/L (ref 3.5–5.1)
Sodium: 133 mmol/L — ABNORMAL LOW (ref 135–145)
Total Bilirubin: 1 mg/dL (ref 0.3–1.2)
Total Protein: 7.2 g/dL (ref 6.5–8.1)

## 2021-04-21 LAB — CBC
HCT: 36.5 % — ABNORMAL LOW (ref 39.0–52.0)
HCT: 37.6 % — ABNORMAL LOW (ref 39.0–52.0)
Hemoglobin: 13.1 g/dL (ref 13.0–17.0)
Hemoglobin: 13 g/dL (ref 13.0–17.0)
MCH: 35.2 pg — ABNORMAL HIGH (ref 26.0–34.0)
MCH: 34.8 pg — ABNORMAL HIGH (ref 26.0–34.0)
MCHC: 35.9 g/dL (ref 30.0–36.0)
MCHC: 34.6 g/dL (ref 30.0–36.0)
MCV: 98.1 fL (ref 80.0–100.0)
MCV: 100.5 fL — ABNORMAL HIGH (ref 80.0–100.0)
Platelets: 286 10*3/uL (ref 150–400)
Platelets: 311 10*3/uL (ref 150–400)
RBC: 3.72 MIL/uL — ABNORMAL LOW (ref 4.22–5.81)
RBC: 3.74 MIL/uL — ABNORMAL LOW (ref 4.22–5.81)
RDW: 13.9 % (ref 11.5–15.5)
RDW: 14.2 % (ref 11.5–15.5)
WBC: 17.3 10*3/uL — ABNORMAL HIGH (ref 4.0–10.5)
WBC: 17.8 10*3/uL — ABNORMAL HIGH (ref 4.0–10.5)
nRBC: 0 % (ref 0.0–0.2)
nRBC: 0 % (ref 0.0–0.2)

## 2021-04-21 LAB — URINALYSIS, COMPLETE (UACMP) WITH MICROSCOPIC
Bacteria, UA: NONE SEEN
Bilirubin Urine: NEGATIVE
Glucose, UA: NEGATIVE mg/dL
Ketones, ur: NEGATIVE mg/dL
Nitrite: NEGATIVE
Protein, ur: 100 mg/dL — AB
Specific Gravity, Urine: 1.01 (ref 1.005–1.030)
WBC, UA: >50 WBC/hpf — ABNORMAL HIGH (ref 0–5)
pH: 7 (ref 5.0–8.0)

## 2021-04-21 LAB — RESP PANEL BY RT-PCR (FLU A&B, COVID) ARPGX2
Influenza A by PCR: NEGATIVE
Influenza B by PCR: NEGATIVE
SARS Coronavirus 2 by RT PCR: NEGATIVE

## 2021-04-21 LAB — LACTIC ACID, PLASMA
Lactic Acid, Venous: 2.2 mmol/L (ref 0.5–1.9)
Lactic Acid, Venous: 1.5 mmol/L (ref 0.5–1.9)

## 2021-04-21 LAB — CHLAMYDIA/NGC RT PCR (ARMC ONLY)
Chlamydia Tr: NOT DETECTED
N gonorrhoeae: NOT DETECTED

## 2021-04-21 MED ORDER — MORPHINE SULFATE (PF) 4 MG/ML IV SOLN
4.0000 mg | Freq: Once | INTRAVENOUS | Status: AC
Start: 2021-04-21 — End: 2021-04-21
  Administered 2021-04-21: 4 mg via INTRAVENOUS
  Filled 2021-04-21: qty 1

## 2021-04-21 MED ORDER — SENNOSIDES-DOCUSATE SODIUM 8.6-50 MG PO TABS: 1.0000 | ORAL_TABLET | Freq: Every evening | ORAL | Status: DC | PRN

## 2021-04-21 MED ORDER — SODIUM CHLORIDE 0.9 % IV SOLN
1.0000 g | Freq: Once | INTRAVENOUS | Status: AC
  Administered 2021-04-21: 1 g via INTRAVENOUS
  Filled 2021-04-21: qty 1

## 2021-04-21 MED ORDER — SODIUM CHLORIDE 0.9 % IV SOLN
1.0000 g | Freq: Two times a day (BID) | INTRAVENOUS | Status: DC
  Administered 2021-04-22: 1 g via INTRAVENOUS
  Filled 2021-04-21: qty 1

## 2021-04-21 MED ORDER — ENOXAPARIN SODIUM 40 MG/0.4ML IJ SOSY
40.0000 mg | PREFILLED_SYRINGE | INTRAMUSCULAR | Status: DC
  Administered 2021-04-21 – 2021-04-23 (×3): 40 mg via SUBCUTANEOUS
  Filled 2021-04-21 (×3): qty 0.4

## 2021-04-21 MED ORDER — OXYCODONE HCL 5 MG PO TABS
10.0000 mg | ORAL_TABLET | ORAL | Status: DC | PRN
  Administered 2021-04-21 – 2021-04-24 (×7): 10 mg via ORAL
  Filled 2021-04-21 (×7): qty 2

## 2021-04-21 MED ORDER — LACTATED RINGERS IV SOLN: INTRAVENOUS | Status: AC

## 2021-04-21 MED ORDER — IOHEXOL 350 MG/ML SOLN
100.0000 mL | Freq: Once | INTRAVENOUS | Status: AC | PRN
  Administered 2021-04-21: 100 mL via INTRAVENOUS

## 2021-04-21 MED ORDER — LACTATED RINGERS IV BOLUS (SEPSIS)
1000.0000 mL | Freq: Once | INTRAVENOUS | Status: AC
  Administered 2021-04-21: 1000 mL via INTRAVENOUS

## 2021-04-21 MED ORDER — ACETAMINOPHEN 650 MG RE SUPP: 650.0000 mg | Freq: Four times a day (QID) | RECTAL | Status: DC | PRN

## 2021-04-21 MED ORDER — ONDANSETRON HCL 4 MG/2ML IJ SOLN
4.0000 mg | Freq: Four times a day (QID) | INTRAMUSCULAR | Status: DC | PRN
  Administered 2021-04-22: 4 mg via INTRAVENOUS
  Filled 2021-04-21: qty 2

## 2021-04-21 MED ORDER — ONDANSETRON HCL 4 MG PO TABS: 4.0000 mg | ORAL_TABLET | Freq: Four times a day (QID) | ORAL | Status: DC | PRN

## 2021-04-21 MED ORDER — KETOROLAC TROMETHAMINE 15 MG/ML IJ SOLN
15.0000 mg | Freq: Four times a day (QID) | INTRAMUSCULAR | Status: DC | PRN
  Filled 2021-04-21: qty 1

## 2021-04-21 MED ORDER — ONDANSETRON HCL 4 MG/2ML IJ SOLN
4.0000 mg | Freq: Once | INTRAMUSCULAR | Status: AC
  Administered 2021-04-21: 4 mg via INTRAVENOUS
  Filled 2021-04-21: qty 2

## 2021-04-21 MED ORDER — SODIUM CHLORIDE 0.9 % IV SOLN: INTRAVENOUS | Status: DC

## 2021-04-21 MED ORDER — MORPHINE SULFATE (PF) 4 MG/ML IV SOLN
4.0000 mg | INTRAVENOUS | Status: DC | PRN
  Administered 2021-04-22: 4 mg via INTRAVENOUS
  Filled 2021-04-21: qty 1

## 2021-04-21 MED ORDER — ACETAMINOPHEN 325 MG PO TABS
650.0000 mg | ORAL_TABLET | Freq: Four times a day (QID) | ORAL | Status: DC | PRN
  Administered 2021-04-22 (×2): 650 mg via ORAL
  Filled 2021-04-21 (×2): qty 2

## 2021-04-21 NOTE — Consult Note (Signed)
CODE SEPSIS - PHARMACY COMMUNICATION  **Broad Spectrum Antibiotics should be administered within 1 hour of Sepsis diagnosis**  Time Code Sepsis Called/Page Received: 2028  Antibiotics Ordered: 2028  Time of 1st antibiotic administration: 2103  Additional action taken by pharmacy: N/A  If necessary, Name of Provider/Nurse Contacted: N/A    Derrek Gu ,PharmD Clinical Pharmacist  04/21/2021  8:40 PM

## 2021-04-21 NOTE — Consult Note (Signed)
Pharmacy Antibiotic Note  Edwin Martin is a 65 y.o. male admitted on 04/21/2021 with UTI/sepsis.  Pharmacy has been consulted for meropenem dosing (anaphylaxis with PCN products and no history of cephalosporin use found).  Plan: - Will start meropenem 1g q12h  - Will monitor renal function for dose adjustment - Will monitor urine culture for possible de-escalation   Height: 5\' 8"  (172.7 cm) Weight: 59 kg (130 lb) IBW/kg (Calculated) : 68.4  Temp (24hrs), Avg:99.6 F (37.6 C), Min:99.1 F (37.3 C), [place]:100 F (37.8 C)  Recent Labs  Lab 04/21/21 1638 04/21/21 1639 04/21/21 2025 04/21/21 2212  WBC 17.3*  --   --  17.8*  CREATININE 0.95  --   --  1.23  LATICACIDVEN  --  2.2* 1.5  --     Estimated Creatinine Clearance: 49.3 mL/min (by C-G formula based on SCr of 1.23 mg/dL).    Allergies  Allergen Reactions   Lyrica [Pregabalin] Other (See Comments)    Constipation , evil,  Extremely bad temper.    Penicillins Hives    Has patient had a PCN reaction causing immediate rash, facial/tongue/throat swelling, SOB or lightheadedness with hypotension: Yes Has patient had a PCN reaction causing severe rash involving mucus membranes or skin necrosis: No Has patient had a PCN reaction that required hospitalization No Has patient had a PCN reaction occurring within the last 10 years: No If all of the above answers are "NO", then may proceed with Cephalosporin use.   Prednisone Anxiety    Antimicrobials this admission: Meropenem 7/27 >>  Dose adjustments this admission: N/A  Microbiology results: 7/27 BCx: pending 7/27 UCx: pending    Thank you for allowing pharmacy to be a part of this patient's care.  8/27, PharmD, BCPS Clinical Pharmacist 04/21/2021 11:38 PM

## 2021-04-21 NOTE — H&P (Signed)
History and Physical    Jeff Vaughan Sine Hodes WUJ:[id number] DOB: May 24, 1956 DOA: 04/21/2021  PCP: Danella PentonMiller, Mark F, MD   Patient coming from: Home  I have personally briefly reviewed patient's old medical records [facility]  Chief Complaint: Back pain, fatigue  HPI: Edwin Martin is a 65 y.o. male with medical history significant for chronic pain secondary to lumbar and cervical radiculopathy s/p spinal fusion on chronic opiates who presents to the ED with history of burning on urination and lower abdominal pain radiating to the back moderate to severe intensity nausea without vomiting and a fever of 102 at home.  He took Tylenol but symptoms persisted and today started feeling  very fatigued and decided to come in for evaluation.  He denies cough, chest pain or shortness of breath and denies diarrhea.  ED Course: Upon arrival, temperature 100, BP 100/72, pulse 94 with O2 sat 96% on room air.  Blood work significant for WBC 17,000 with lactic acid 2.2-1.5.  Urinalysis with large leukocyte esterase and greater than 50 WBC per hpf no bacteria.  Otherwise unremarkable.  COVID and flu negative.  Imaging CT abdomen and pelvis showing right-sided pyelonephritis with no drainable fluid collection or abscess.  No bowel obstruction chest x-ray with minimal patchy left lung base opacity which may be atelectasis or pneumonia  Patient started on meropenem and IV fluid bolus.  Hospitalist consulted for admission.  Review of Systems: As per HPI otherwise all other systems on review of systems negative.    Past Medical History:  Diagnosis Date   Arthritis    Hyperlipidemia     Past Surgical History:  Procedure Laterality Date   BACK SURGERY     L5   NECK SURGERY       reports that he has been smoking cigarettes. He has a 15.00 pack-year smoking history. He has never used smokeless tobacco. He reports that he does not drink alcohol. No history on file for drug use.  Allergies  Allergen Reactions    Lyrica [Pregabalin] Other (See Comments)    Constipation , evil,  Extremely bad temper.    Penicillins Hives    Has patient had a PCN reaction causing immediate rash, facial/tongue/throat swelling, SOB or lightheadedness with hypotension: Yes Has patient had a PCN reaction causing severe rash involving mucus membranes or skin necrosis: No Has patient had a PCN reaction that required hospitalization No Has patient had a PCN reaction occurring within the last 10 years: No If all of the above answers are "NO", then may proceed with Cephalosporin use.   Prednisone Anxiety    Family History  Problem Relation Age of Onset   Asthma Mother    Asthma Sister       Prior to Admission medications   Medication Sig Start Date End Date Taking? Authorizing Provider  acetaminophen (TYLENOL) 325 MG tablet Take 650 mg by mouth every 6 (six) hours as needed.    [provider]  aspirin-acetaminophen-caffeine (EXCEDRIN MIGRAINE) [id number] MG tablet Take by mouth every 6 (six) hours as needed for headache.    [provider]  Cyanocobalamin (VITAMIN B12) 3000 MCG SUBL Place 1 lozenge under the tongue daily.    [provider]  folic acid (FOLVITE) 1 MG tablet Take 1 mg by mouth daily.    [provider]  HYDROcodone-acetaminophen (NORCO) 7.5-325 MG tablet Take 2 tablets by mouth 3 (three) times daily as needed.    [provider]  simvastatin (ZOCOR) 20 MG  tablet Take 40 mg by mouth every evening.    [provider]    Physical Exam: Vitals:   04/21/21 1634 04/21/21 1635 04/21/21 2031 04/21/21 2106  BP:   102/84 119/78  Pulse: (!) 130  (!) 107 97  Resp:   14 (!) 24  Temp:   99.1 F (37.3 C)   TempSrc:   Oral   SpO2:   100% 99%  Weight:  59 kg    Height:  5\' 8"  (1.727 m)       Vitals:   04/21/21 1634 04/21/21 1635 04/21/21 2031 04/21/21 2106  BP:   102/84 119/78  Pulse: (!) 130  (!) 107 97  Resp:   14 (!) 24  Temp:   99.1 F (37.3 C)    TempSrc:   Oral   SpO2:   100% 99%  Weight:  59 kg    Height:  5\' 8"  (1.727 m)        Constitutional: Alert and oriented x 3 . Not in any apparent distress HEENT:      Head: Normocephalic and atraumatic.         Eyes: PERLA, EOMI, Conjunctivae are normal. Sclera is non-icteric.       Mouth/Throat: Mucous membranes are moist.       Neck: Supple with no signs of meningismus. Cardiovascular: Regular rate and rhythm. No murmurs, gallops, or rubs. 2+ symmetrical distal pulses are present . No JVD. No LE edema Respiratory: Respiratory effort normal .Lungs sounds clear bilaterally. No wheezes, crackles, or rhonchi.  Gastrointestinal: Soft, tender right flank, non distended with positive bowel sounds.  Genitourinary: Right CVA tenderness. Musculoskeletal: Nontender with normal range of motion in all extremities. No cyanosis, or erythema of extremities. Neurologic:  Face is symmetric. Moving all extremities. No gross focal neurologic deficits . Skin: Skin is warm, dry.  No rash or ulcers Psychiatric: Mood and affect are normal    Labs on Admission: I have personally reviewed following labs and imaging studies  CBC: Recent Labs  Lab 04/21/21 1638  WBC 17.3*  HGB 13.1  HCT 36.5*  MCV 98.1  PLT 286   Basic Metabolic Panel: Recent Labs  Lab 04/21/21 1638  NA 133*  K 3.9  CL 101  CO2 21*  GLUCOSE 123*  BUN 9  CREATININE 0.95  CALCIUM 9.1   GFR: Estimated Creatinine Clearance: 63.8 mL/min (by C-G formula based on SCr of 0.95 mg/dL). Liver Function Tests: Recent Labs  Lab 04/21/21 1638  AST 19  ALT 7  ALKPHOS 85  BILITOT 1.0  PROT 7.2  ALBUMIN 3.5   No results for input(s): LIPASE, AMYLASE in the last 168 hours. No results for input(s): AMMONIA in the last 168 hours. Coagulation Profile: Recent Labs  Lab 04/21/21 2032  INR 1.0   Cardiac Enzymes: No results for input(s): CKTOTAL, CKMB, CKMBINDEX, TROPONINI in the last 168 hours. BNP (last 3 results) No  results for input(s): PROBNP in the last 8760 hours. HbA1C: No results for input(s): HGBA1C in the last 72 hours. CBG: No results for input(s): GLUCAP in the last 168 hours. Lipid Profile: No results for input(s): CHOL, HDL, LDLCALC, TRIG, CHOLHDL, LDLDIRECT in the last 72 hours. Thyroid Function Tests: No results for input(s): TSH, T4TOTAL, FREET4, T3FREE, THYROIDAB in the last 72 hours. Anemia Panel: No results for input(s): VITAMINB12, FOLATE, FERRITIN, TIBC, IRON, RETICCTPCT in the last 72 hours. Urine analysis:    Component Value Date/Time   COLORURINE YELLOW (A) 04/21/2021 1637  APPEARANCEUR HAZY (A) 04/21/2021 1637   APPEARANCEUR Cloudy 05/26/2013 1607   LABSPEC 1.010 04/21/2021 1637   LABSPEC 1.017 05/26/2013 1607   PHURINE 7.0 04/21/2021 1637   GLUCOSEU NEGATIVE 04/21/2021 1637   GLUCOSEU Negative 05/26/2013 1607   HGBUR SMALL (A) 04/21/2021 1637   BILIRUBINUR NEGATIVE 04/21/2021 1637   BILIRUBINUR Negative 05/26/2013 1607   KETONESUR NEGATIVE 04/21/2021 1637   PROTEINUR 100 (A) 04/21/2021 1637   NITRITE NEGATIVE 04/21/2021 1637   LEUKOCYTESUR LARGE (A) 04/21/2021 1637   LEUKOCYTESUR 3+ 05/26/2013 1607    Radiological Exams on Admission: CT ABDOMEN PELVIS W CONTRAST  Result Date: 04/21/2021 CLINICAL DATA:  65 year old male with abdominal pain. EXAM: CT ABDOMEN AND PELVIS WITH CONTRAST TECHNIQUE: Multidetector CT imaging of the abdomen and pelvis was performed using the standard protocol following bolus administration of intravenous contrast. CONTRAST:  OMNIPAQUE IOHEXOL 350 MG/ML SOLN COMPARISON:  CT abdomen pelvis dated 06/03/2016. FINDINGS: Lower chest: The visualized lung bases are clear. There is coronary vascular calcification. No intra-abdominal free air or free fluid. Hepatobiliary: No focal liver abnormality is seen. No gallstones, gallbladder wall thickening, or biliary dilatation. Pancreas: Several scattered foci of calcification in the head and uncinate  process of the pancreas, likely sequela prior pancreatitis. No active inflammatory changes. No drainable fluid collection or pseudocyst. Spleen: Normal in size without focal abnormality. Adrenals/Urinary Tract: The adrenal glands are unremarkable. There is no hydronephrosis on either side. There is heterogeneous enhancement of the right renal parenchyma most consistent with pyelonephritis. Correlation with urinalysis recommended. No drainable fluid collection/abscess. There is mild right perinephric stranding and haziness. The visualized ureters appear unremarkable. There is diffuse thickening of the bladder wall. Stomach/Bowel: There is no bowel obstruction or active inflammation. Metallic clips noted adjacent to the cecum. The appendix is not visualized and may be surgically absent. No inflammatory changes noted in the right lower quadrant. Vascular/Lymphatic: Moderate aortoiliac atherosclerotic disease. The IVC is unremarkable. No portal venous gas. There is no adenopathy. Reproductive: The prostate gland is mildly enlarged measuring 5.4 cm in transverse axial diameter. Other: Bilateral inguinal hernia repair mesh. Musculoskeletal: Degenerative changes of the spine. No acute osseous pathology. IMPRESSION: 1. Findings most consistent with right-sided pyelonephritis. Correlation with urinalysis recommended. No drainable fluid collection/abscess. 2. No bowel obstruction. 3. Aortic Atherosclerosis (ICD10-I70.0). Electronically Signed   By: Elgie Collard M.D.   On: 04/21/2021 21:08   DG Chest Port 1 View  Result Date: 04/21/2021 CLINICAL DATA:  Questionable sepsis. Evaluate for abnormality. Severe back pain and urinary retention. Fever. EXAM: PORTABLE CHEST 1 VIEW COMPARISON:  Chest radiograph and CT 06/03/2016 FINDINGS: Normal heart size. Aortic atherosclerosis and tortuosity. Coarse lung markings appear chronic. Minimal patchy left lung base opacity. There is biapical pleuroparenchymal scarring. No pulmonary  edema, pleural effusion, or pneumothorax. No acute osseous abnormalities are seen. Surgical hardware in the lower cervical spine partially included. IMPRESSION: 1. Minimal patchy left lung base opacity may be atelectasis or pneumonia. 2. Chronic interstitial coarsening. Electronically Signed   By: Narda Rutherford M.D.   On: 04/21/2021 20:47     Assessment/Plan 65 year old male with history of chronic pain secondary to lumbar and cervical radiculopathy s/p spinal fusion on chronic opiates presenting with dysuria, abdominal pain and fever.      Acute pyelonephritis   Sepsis (HCC) - Sepsis criteria include fever, tachycardia, leukocytosis and lactic acidosis to 2.2 with abnormal UA -CT abdomen and pelvis showing right-sided pyelonephritis with no drainable fluid collection or abscess - Continue meropenem started in  the ED(patient has severe penicillin allergy) - Continue sepsis fluids - Pain control with Toradol and opiates (patient on high baseline opioid doses) - Follow cultures    Cervical radicular pain s/p fusion   Lumbar radiculopathy s/p fusion   Long term prescription opiate use - Pain management as above - At baseline on hydrocodone 7.5/325 2 tablets 3 times daily    DVT prophylaxis: Lovenox  Code Status: full code  Family Communication:  none  Disposition Plan: Back to previous home environment Consults called: none  Status:At the time of admission, it appears that the appropriate admission status for this patient is INPATIENT. This is judged to be reasonable and necessary in order to provide the required intensity of service to ensure the patient's safety given the presenting symptoms, physical exam findings, and initial radiographic and laboratory data in the context of their  Comorbid conditions.   Patient requires inpatient status due to high intensity of service, high risk for further deterioration and high frequency of surveillance required.   I certify that at the point of  admission it is my clinical judgment that the patient will require inpatient hospital care spanning beyond 2 midnights     Andris Baumann MD [facility]     04/21/2021, 9:35 PM

## 2021-04-21 NOTE — ED Triage Notes (Signed)
Arrived by EMS from home. C/o severe back pain and urinary rentention for a few days. Current fever. No cp or sob. Blood sugar 131, 102.8 oral temp, 98% RA, 175/98. EMS administered 640mg  tylenol

## 2021-04-21 NOTE — Sepsis Progress Note (Signed)
Following per sepsis protocol   

## 2021-04-21 NOTE — Consult Note (Signed)
PHARMACY -  BRIEF ANTIBIOTIC NOTE   Pharmacy has received consult(s) for meronem from an ED provider.  The patient's profile has been reviewed for ht/wt/allergies/indication/available labs.    One time order(s) placed for meropenem 1 g  Further antibiotics/pharmacy consults should be ordered by admitting physician if indicated.                       Thank you, Derrek Gu, PharmD 04/21/2021  9:10 PM

## 2021-04-21 NOTE — ED Notes (Signed)
MD paduchowski made aware about lactic acid 2.2

## 2021-04-21 NOTE — ED Provider Notes (Signed)
[facility] Emergency Department Provider Note  Time seen: 8:28 PM  I have reviewed the triage vital signs and the nursing notes.   HISTORY  Chief Complaint Fever and Back Pain   HPI Edwin Martin is a 65 y.o. male with a past medical history of arthritis, hyperlipidemia, chronic pain, presents to the emergency department for evaluation of abdominal and back pain along with dysuria and fever.  According to the patient over the past several days he has been using Tylenol for abdominal and back pain as well as subjective fever.  Patient states he became more weak and nauseated today so he came to the emergency department for evaluation.  Patient describes dysuria burning sensation over the past few days along with pain throughout his abdomen and into his "kidneys."  Patient states nausea at times, denies any significant cough or shortness of breath.  States generalized fatigue and weakness.   Past Medical History:  Diagnosis Date   Arthritis    Hyperlipidemia     Patient Active Problem List   Diagnosis Date Noted   Cervical radicular pain 08/29/2019   Lumbar radiculopathy 08/29/2019   History of lumbar fusion (L5-S1 microdiscectomy  1992) 08/29/2019   S/P cervical spinal fusion (c5-7 1998) 08/29/2019   Cervical facet joint syndrome 08/29/2019   Lumbar degenerative disc disease 08/29/2019   Chronic pain syndrome 08/29/2019   Opiate dependence, continuous (HCC) 08/29/2019   Long term prescription opiate use 08/29/2019    Past Surgical History:  Procedure Laterality Date   BACK SURGERY     L5   NECK SURGERY      Prior to Admission medications   Medication Sig Start Date End Date Taking? Authorizing Provider  acetaminophen (TYLENOL) 325 MG tablet Take 650 mg by mouth every 6 (six) hours as needed.    [provider]  aspirin-acetaminophen-caffeine (EXCEDRIN MIGRAINE) [id number] MG tablet Take by mouth every 6 (six) hours as needed for headache.     [provider]  Cyanocobalamin (VITAMIN B12) 3000 MCG SUBL Place 1 lozenge under the tongue daily.    [provider]  folic acid (FOLVITE) 1 MG tablet Take 1 mg by mouth daily.    [provider]  HYDROcodone-acetaminophen (NORCO) 7.5-325 MG tablet Take 2 tablets by mouth 3 (three) times daily as needed.    [provider]  simvastatin (ZOCOR) 20 MG tablet Take 40 mg by mouth every evening.    [provider]    Allergies  Allergen Reactions   Lyrica [Pregabalin] Other (See Comments)    Constipation , evil,  Extremely bad temper.    Penicillins Hives    Has patient had a PCN reaction causing immediate rash, facial/tongue/throat swelling, SOB or lightheadedness with hypotension: Yes Has patient had a PCN reaction causing severe rash involving mucus membranes or skin necrosis: No Has patient had a PCN reaction that required hospitalization No Has patient had a PCN reaction occurring within the last 10 years: No If all of the above answers are "NO", then may proceed with Cephalosporin use.   Prednisone Anxiety    Family History  Problem Relation Age of Onset   Asthma Mother    Asthma Sister     Social History Social History   Tobacco Use   Smoking status: Every Day    Packs/day: 1.00    Years: 15.00    Pack years: 15.00    Types: Cigarettes   Smokeless tobacco: Never   Tobacco comments:  pt quit for 20 years and started back  Substance Use Topics   Alcohol use: No    Review of Systems Constitutional: Subjective fever.  Positive generalized fatigue/weakness Cardiovascular: Negative for chest pain. Respiratory: Negative for shortness of breath.  Negative for cough Gastrointestinal: Moderate dull diffuse abdominal pain. Genitourinary: Positive for dysuria Musculoskeletal: Negative for musculoskeletal complaints Neurological: Negative for headache All other ROS  negative  ____________________________________________   PHYSICAL EXAM:  VITAL SIGNS: ED Triage Vitals  Enc Vitals Group     BP 04/21/21 1633 110/72     Pulse Rate 04/21/21 1633 94     Resp 04/21/21 1633 18     Temp 04/21/21 1633 100 F (37.8 C)     Temp Source 04/21/21 1633 Oral     SpO2 04/21/21 1633 96 %     Weight 04/21/21 1635 130 lb (59 kg)     Height 04/21/21 1635 5\' 8"  (1.727 m)     Head Circumference --      Peak Flow --      Pain Score 04/21/21 1634 5     Pain Loc --      Pain Edu? --      Excl. in GC? --    Constitutional: Alert and oriented. Well appearing and in no distress. Eyes: Normal exam ENT      Head: Normocephalic and atraumatic.      Mouth/Throat: Mucous membranes are moist. Cardiovascular: Regular rhythm rate around 120 bpm.  No obvious murmur. Respiratory: Normal respiratory effort without tachypnea nor retractions. Breath sounds are clear  Gastrointestinal: Soft, moderate diffuse tenderness palpation without focal area of tenderness identified.  No rebound guarding or distention. Musculoskeletal: Nontender with normal range of motion in all extremities Neurologic:  Normal speech and language. No gross focal neurologic deficits Skin:  Skin is warm, dry and intact.  Psychiatric: Mood and affect are normal.   ____________________________________________    EKG  EKG viewed and interpreted by myself shows a normal sinus rhythm at 96 bpm with a narrow QRS, normal axis, normal intervals, no concerning ST changes.  ____________________________________________    RADIOLOGY  CT consistent right-sided pyelonephritis. Chest x-ray shows left lung opacity, atelectasis versus pneumonia.  ____________________________________________   INITIAL IMPRESSION / ASSESSMENT AND PLAN / ED COURSE  Pertinent labs & imaging results that were available during my care of the patient were reviewed by me and considered in my medical decision making (see chart for  details).   Patient presents emergency department for dysuria subjective fever generalized fatigue weakness nausea abdominal pain and back pain.  Patient's work-up so far shows significant leukocytosis of 17,000 with a large amount of white blood cells in his urine.  Patient has fairly diffuse tenderness across his abdomen as well as CVA tenderness.  Patient febrile to 100 tachycardic to 130 meeting sepsis criteria.  We will check cultures, start broad-spectrum antibiotics.  Given the patient's abdominal tenderness we will obtain a CT scan of the abdomen/pelvis to further evaluate.  Patient will require admission to the hospital service once his work-up is been completed.  Patient's lactic acid is elevated as well, receiving IV fluids.  CT consistent with pyelonephritis.  Given the patient's elevated white blood cell count, fever, tachycardia meeting sepsis criteria.  Patient will be admitted to the hospitalist service for further work-up and treatment.  Patient agreeable to plan of care.  Edwin Martin was evaluated in Emergency Department on 04/21/2021 for the symptoms described in the history of present illness.  He was evaluated in the context of the global COVID-19 pandemic, which necessitated consideration that the patient might be at risk for infection with the SARS-CoV-2 virus that causes COVID-19. Institutional protocols and algorithms that pertain to the evaluation of patients at risk for COVID-19 are in a state of rapid change based on information released by regulatory bodies including the CDC and federal and state organizations. These policies and algorithms were followed during the patient's care in the ED.  CRITICAL CARE Performed by: Minna Antis   Total critical care time: 30 minutes  Critical care time was exclusive of separately billable procedures and treating other patients.  Critical care was necessary to treat or prevent imminent or life-threatening deterioration.  Critical  care was time spent personally by me on the following activities: development of treatment plan with patient and/or surrogate as well as nursing, discussions with consultants, evaluation of patient's response to treatment, examination of patient, obtaining history from patient or surrogate, ordering and performing treatments and interventions, ordering and review of laboratory studies, ordering and review of radiographic studies, pulse oximetry and re-evaluation of patient's condition.  ____________________________________________   FINAL CLINICAL IMPRESSION(S) / ED DIAGNOSES  Sepsis Urinary tract infection Pyelonephritis   Minna Antis, MD 04/21/21 2141

## 2021-04-22 DIAGNOSIS — N10 Acute tubulo-interstitial nephritis: Secondary | ICD-10-CM

## 2021-04-22 LAB — BASIC METABOLIC PANEL
Anion gap: 11 (ref 5–15)
BUN: 13 mg/dL (ref 8–23)
CO2: 24 mmol/L (ref 22–32)
Calcium: 7.9 mg/dL — ABNORMAL LOW (ref 8.9–10.3)
Chloride: 95 mmol/L — ABNORMAL LOW (ref 98–111)
Creatinine, Ser: 1.23 mg/dL (ref 0.61–1.24)
GFR, Estimated: >60 mL/min (ref >60)
Glucose, Bld: 97 mg/dL (ref 70–99)
Potassium: 4.4 mmol/L (ref 3.5–5.1)
Sodium: 130 mmol/L — ABNORMAL LOW (ref 135–145)

## 2021-04-22 LAB — CBC
HCT: 31.1 % — ABNORMAL LOW (ref 39.0–52.0)
Hemoglobin: 11 g/dL — ABNORMAL LOW (ref 13.0–17.0)
MCH: 35 pg — ABNORMAL HIGH (ref 26.0–34.0)
MCHC: 35.4 g/dL (ref 30.0–36.0)
MCV: 99 fL (ref 80.0–100.0)
Platelets: 246 10*3/uL (ref 150–400)
RBC: 3.14 MIL/uL — ABNORMAL LOW (ref 4.22–5.81)
RDW: 14.2 % (ref 11.5–15.5)
WBC: 19.3 10*3/uL — ABNORMAL HIGH (ref 4.0–10.5)
nRBC: 0 % (ref 0.0–0.2)

## 2021-04-22 LAB — HIV ANTIBODY (ROUTINE TESTING W REFLEX): HIV Screen 4th Generation wRfx: NONREACTIVE

## 2021-04-22 MED ORDER — VITAMIN B-12 1000 MCG PO TABS
1000.0000 ug | ORAL_TABLET | Freq: Every day | ORAL | Status: DC
  Administered 2021-04-22 – 2021-04-24 (×3): 1000 ug via ORAL
  Filled 2021-04-22 (×3): qty 1

## 2021-04-22 MED ORDER — SIMVASTATIN 20 MG PO TABS
40.0000 mg | ORAL_TABLET | Freq: Every day | ORAL | Status: DC
  Administered 2021-04-22 – 2021-04-23 (×2): 40 mg via ORAL
  Filled 2021-04-22 (×2): qty 2

## 2021-04-22 MED ORDER — CIPROFLOXACIN IN D5W 400 MG/200ML IV SOLN
400.0000 mg | Freq: Two times a day (BID) | INTRAVENOUS | Status: DC
  Administered 2021-04-22 – 2021-04-24 (×4): 400 mg via INTRAVENOUS
  Filled 2021-04-22 (×6): qty 200

## 2021-04-22 MED ORDER — FOLIC ACID 1 MG PO TABS
1.0000 mg | ORAL_TABLET | Freq: Every day | ORAL | Status: DC
  Administered 2021-04-22 – 2021-04-24 (×3): 1 mg via ORAL
  Filled 2021-04-22 (×3): qty 1

## 2021-04-22 NOTE — ED Notes (Signed)
Pt ambulated to the restroom.

## 2021-04-22 NOTE — Progress Notes (Signed)
   04/22/21 1532  Assess: MEWS Score  Temp (!) 101.9 F (38.8 C)  BP 111/61  Pulse Rate 99  Resp 19  Level of Consciousness Alert  SpO2 92 %  O2 Device Room Air  Assess: MEWS Score  MEWS Temp 2  MEWS Systolic 0  MEWS Pulse 0  MEWS RR 0  MEWS LOC 0  MEWS Score 2  MEWS Score Color Yellow  Assess: if the MEWS score is Yellow or Red  Were vital signs taken at a resting state? Yes  Focused Assessment No change from prior assessment  Does the patient meet 2 or more of the SIRS criteria? No  MEWS guidelines implemented *See Row Information* Yes  Treat  MEWS Interventions Administered prn meds/treatments  Pain Scale 0-10  Pain Score 8  Pain Type Acute pain  Pain Location Flank  Pain Descriptors / Indicators Aching;Sharp  Pain Frequency Constant  Patients Stated Pain Goal 2  Pain Intervention(s) Medication (See eMAR)  Take Vital Signs  Increase Vital Sign Frequency  Yellow: Q 2hr X 2 then Q 4hr X 2, if remains yellow, continue Q 4hrs  Escalate  MEWS: Escalate Yellow: discuss with charge nurse/RN and consider discussing with provider and RRT  Notify: Charge Nurse/RN  Name of Charge Nurse/RN Notified Debi RN  Date Charge Nurse/RN Notified 04/22/21  Time Charge Nurse/RN Notified 1540  Notify: Provider  Provider Name/Title Dr Georgeann Oppenheim  Date Provider Notified 04/22/21  Time Provider Notified 1545  Notification Type Call  Notification Reason Other (Comment) (yellow mews, increased temp)  Provider response No new orders  Date of Provider Response 04/22/21  Time of Provider Response 1600  Document  Patient Outcome Stabilized after interventions  Progress note created (see row info) Yes

## 2021-04-22 NOTE — Progress Notes (Signed)
PROGRESS NOTE    Edwin Martin  QVZ:[id number] DOB: 19-Apr-1956 DOA: 04/21/2021 PCP: Rusty Aus, MD   Brief Narrative  65 y.o. male with medical history significant for chronic pain secondary to lumbar and cervical radiculopathy s/p spinal fusion on chronic opiates who presents to the ED with history of burning on urination and lower abdominal pain radiating to the back moderate to severe intensity nausea without vomiting and a fever of 102 at home.  He took Tylenol but symptoms persisted and today started feeling  very fatigued and decided to come in for evaluation.  He denies cough, chest pain or shortness of breath and denies diarrhea.  Radiographic evidence of right-sided pyelonephritis.  Endorses improvement in back pain since admission.   Assessment & Plan:   Principal Problem:   Acute pyelonephritis Active Problems:   Cervical radicular pain   Lumbar radiculopathy   Long term prescription opiate use   Sepsis ([place])  Acute right-sided pyelonephritis Severe sepsis secondary to above Sepsis criteria met with fever, tachycardia, leukocytosis, lactic acidosis 2.2 Source is urine CT imaging survey significant for right-sided pyelonephritis, no treatable fluid collection or abscess Patient was started on meropenem in ED due to severe penicillin allergy Unlikely that patient will have cross-reactivity to cephalosporins but he is unwilling to try due to prior experiences with penicillins Plan: DC meropenem Start IV ciprofloxacin, pharmacy dosing Continue supplemental fluids Multimodal pain control Monitor vitals and fever curve Follow cultures     Cervical radicular pain s/p fusion   Lumbar radiculopathy s/p fusion   Long term prescription opiate use Multimodal pain control   DVT prophylaxis: SQ Lovenox Code Status: Full Family Communication: None today Disposition Plan: Status is: Inpatient  Remains inpatient appropriate because:Inpatient level of care appropriate due  to severity of illness  Dispo: The patient is from: Home              Anticipated d/c is to: Home              Patient currently is not medically stable to d/c.   Difficult to place patient No       Level of care: Med-Surg  Consultants:  None  Procedures:  None  Antimicrobials:  Ciprofloxacin   Subjective: Patient seen and examined.  Remains NED.  Endorses improvement in symptoms since admission.  Does endorse back pain.  No further complaints  Objective: Vitals:   04/22/21 1033 04/22/21 1230 04/22/21 1300 04/22/21 1400  BP: 122/70 101/69 120/71 125/80  Pulse: 72 95 (!) 104 (!) 109  Resp: 18 20 (!) 21 19  Temp: 97.7 F (36.5 C)   98.2 F (36.8 C)  TempSrc: Oral   Oral  SpO2: 95% 92% 93% 92%  Weight:      Height:        Intake/Output Summary (Last 24 hours) at 04/22/2021 1441 Last data filed at 04/21/2021 2213 Gross per 24 hour  Intake 1100 ml  Output --  Net 1100 ml   Filed Weights   04/21/21 1635  Weight: 59 kg    Examination:  General exam: Appears calm and comfortable  Respiratory system: Clear to auscultation. Respiratory effort normal. Cardiovascular system: S1 & S2 heard, RRR. No JVD, murmurs, rubs, gallops or clicks. No pedal edema. Gastrointestinal system: Abdomen is nondistended, soft and nontender. No organomegaly or masses felt. Normal bowel sounds heard. Central nervous system: Alert and oriented. No focal neurological deficits. Extremities: Pain on palpation of back.  Decreased range of motion Skin: No  rashes, lesions or ulcers Psychiatry: Judgement and insight appear normal. Mood & affect appropriate.     Data Reviewed: I have personally reviewed following labs and imaging studies  CBC: Recent Labs  Lab 04/21/21 1638 04/21/21 2212 04/22/21 0609  WBC 17.3* 17.8* 19.3*  HGB 13.1 13.0 11.0*  HCT 36.5* 37.6* 31.1*  MCV 98.1 100.5* 99.0  PLT 286 311 888   Basic Metabolic Panel: Recent Labs  Lab 04/21/21 1638 04/21/21 2212  04/22/21 0609  NA 133*  --  130*  K 3.9  --  4.4  CL 101  --  95*  CO2 21*  --  24  GLUCOSE 123*  --  97  BUN 9  --  13  CREATININE 0.95 1.23 1.23  CALCIUM 9.1  --  7.9*   GFR: Estimated Creatinine Clearance: 49.3 mL/min (by C-G formula based on SCr of 1.23 mg/dL). Liver Function Tests: Recent Labs  Lab 04/21/21 1638  AST 19  ALT 7  ALKPHOS 85  BILITOT 1.0  PROT 7.2  ALBUMIN 3.5   No results for input(s): LIPASE, AMYLASE in the last 168 hours. No results for input(s): AMMONIA in the last 168 hours. Coagulation Profile: Recent Labs  Lab 04/21/21 2032  INR 1.0   Cardiac Enzymes: No results for input(s): CKTOTAL, CKMB, CKMBINDEX, TROPONINI in the last 168 hours. BNP (last 3 results) No results for input(s): PROBNP in the last 8760 hours. HbA1C: No results for input(s): HGBA1C in the last 72 hours. CBG: No results for input(s): GLUCAP in the last 168 hours. Lipid Profile: No results for input(s): CHOL, HDL, LDLCALC, TRIG, CHOLHDL, LDLDIRECT in the last 72 hours. Thyroid Function Tests: No results for input(s): TSH, T4TOTAL, FREET4, T3FREE, THYROIDAB in the last 72 hours. Anemia Panel: No results for input(s): VITAMINB12, FOLATE, FERRITIN, TIBC, IRON, RETICCTPCT in the last 72 hours. Sepsis Labs: Recent Labs  Lab 04/21/21 1639 04/21/21 2025  LATICACIDVEN 2.2* 1.5    Recent Results (from the past 240 hour(s))  Chlamydia/NGC rt PCR ([place] only)     Status: None   Collection Time: 04/21/21  4:38 PM   Specimen: Urine  Result Value Ref Range Status   Specimen source GC/Chlam URINE, RANDOM  Final   Chlamydia Tr NOT DETECTED NOT DETECTED Final   N gonorrhoeae NOT DETECTED NOT DETECTED Final    Comment: (NOTE) This CT/NG assay has not been evaluated in patients with a history of  hysterectomy. Performed at [facility], [address] [place], [place] [address]   Culture, blood (routine x 2)     Status: None (Preliminary result)   Collection Time:  04/21/21  4:46 PM   Specimen: BLOOD  Result Value Ref Range Status   Specimen Description BLOOD BLOOD RIGHT FOREARM  Final   Special Requests   Final    BOTTLES DRAWN AEROBIC AND ANAEROBIC Blood Culture adequate volume   Culture   Final    NO GROWTH < 24 HOURS Performed at [facility], [address], [place], [place] [address]    Report Status PENDING  Incomplete  Culture, blood (routine x 2)     Status: None (Preliminary result)   Collection Time: 04/21/21  8:26 PM   Specimen: BLOOD  Result Value Ref Range Status   Specimen Description BLOOD RIGHT ANTECUBITAL  Final   Special Requests   Final    BOTTLES DRAWN AEROBIC AND ANAEROBIC Blood Culture results may not be optimal due to an inadequate volume of blood received in  culture bottles   Culture   Final    NO GROWTH < 12 HOURS Performed at [facility], [address] [place], [place] [address]    Report Status PENDING  Incomplete  Resp Panel by RT-PCR (Flu A&B, Covid) Nasopharyngeal Swab     Status: None   Collection Time: 04/21/21  8:26 PM   Specimen: Nasopharyngeal Swab; Nasopharyngeal(NP) swabs in vial transport medium  Result Value Ref Range Status   SARS Coronavirus 2 by RT PCR NEGATIVE NEGATIVE Final    Comment: (NOTE) SARS-CoV-2 target nucleic acids are NOT DETECTED.  The SARS-CoV-2 RNA is generally detectable in upper respiratory specimens during the acute phase of infection. The lowest concentration of SARS-CoV-2 viral copies this assay can detect is 138 copies/mL. A negative result does not preclude SARS-Cov-2 infection and should not be used as the sole basis for treatment or other patient management decisions. A negative result may occur with  improper specimen collection/handling, submission of specimen other than nasopharyngeal swab, presence of viral mutation(s) within the areas targeted by this assay, and inadequate number of viral copies(<138 copies/mL). A negative result must be  combined with clinical observations, patient history, and epidemiological information. The expected result is Negative.  Fact Sheet for Patients:  EntrepreneurPulse.com.au  Fact Sheet for Healthcare Providers:  IncredibleEmployment.be  This test is no t yet approved or cleared by the [place] FDA and  has been authorized for detection and/or diagnosis of SARS-CoV-2 by FDA under an Emergency Use Authorization (EUA). This EUA will remain  in effect (meaning this test can be used) for the duration of the COVID-19 declaration under Section 564(b)(1) of the Act, 21 U.S.C.section 360bbb-3(b)(1), unless the authorization is terminated  or revoked sooner.       Influenza A by PCR NEGATIVE NEGATIVE Final   Influenza B by PCR NEGATIVE NEGATIVE Final    Comment: (NOTE) The Xpert Xpress SARS-CoV-2/FLU/RSV plus assay is intended as an aid in the diagnosis of influenza from Nasopharyngeal swab specimens and should not be used as a sole basis for treatment. Nasal washings and aspirates are unacceptable for Xpert Xpress SARS-CoV-2/FLU/RSV testing.  Fact Sheet for Patients: EntrepreneurPulse.com.au  Fact Sheet for Healthcare Providers: IncredibleEmployment.be  This test is not yet approved or cleared by the [place] FDA and has been authorized for detection and/or diagnosis of SARS-CoV-2 by FDA under an Emergency Use Authorization (EUA). This EUA will remain in effect (meaning this test can be used) for the duration of the COVID-19 declaration under Section 564(b)(1) of the Act, 21 U.S.C. section 360bbb-3(b)(1), unless the authorization is terminated or revoked.  Performed at [facility], [address], [place], [place] [address]          Radiology Studies: CT ABDOMEN PELVIS W CONTRAST  Result Date: 04/21/2021 CLINICAL DATA:  65 year old male with abdominal pain. EXAM: CT ABDOMEN AND  PELVIS WITH CONTRAST TECHNIQUE: Multidetector CT imaging of the abdomen and pelvis was performed using the standard protocol following bolus administration of intravenous contrast. CONTRAST:  12m OMNIPAQUE IOHEXOL 350 MG/ML SOLN COMPARISON:  CT abdomen pelvis dated 06/03/2016. FINDINGS: Lower chest: The visualized lung bases are clear. There is coronary vascular calcification. No intra-abdominal free air or free fluid. Hepatobiliary: No focal liver abnormality is seen. No gallstones, gallbladder wall thickening, or biliary dilatation. Pancreas: Several scattered foci of calcification in the head and uncinate process of the pancreas, likely sequela prior pancreatitis. No active inflammatory changes. No drainable fluid collection or pseudocyst. Spleen: Normal in  size without focal abnormality. Adrenals/Urinary Tract: The adrenal glands are unremarkable. There is no hydronephrosis on either side. There is heterogeneous enhancement of the right renal parenchyma most consistent with pyelonephritis. Correlation with urinalysis recommended. No drainable fluid collection/abscess. There is mild right perinephric stranding and haziness. The visualized ureters appear unremarkable. There is diffuse thickening of the bladder wall. Stomach/Bowel: There is no bowel obstruction or active inflammation. Metallic clips noted adjacent to the cecum. The appendix is not visualized and may be surgically absent. No inflammatory changes noted in the right lower quadrant. Vascular/Lymphatic: Moderate aortoiliac atherosclerotic disease. The IVC is unremarkable. No portal venous gas. There is no adenopathy. Reproductive: The prostate gland is mildly enlarged measuring 5.4 cm in transverse axial diameter. Other: Bilateral inguinal hernia repair mesh. Musculoskeletal: Degenerative changes of the spine. No acute osseous pathology. IMPRESSION: 1. Findings most consistent with right-sided pyelonephritis. Correlation with urinalysis recommended.  No drainable fluid collection/abscess. 2. No bowel obstruction. 3. Aortic Atherosclerosis (ICD10-I70.0). Electronically Signed   By: Anner Crete M.D.   On: 04/21/2021 21:08   DG Chest Port 1 View  Result Date: 04/21/2021 CLINICAL DATA:  Questionable sepsis. Evaluate for abnormality. Severe back pain and urinary retention. Fever. EXAM: PORTABLE CHEST 1 VIEW COMPARISON:  Chest radiograph and CT 06/03/2016 FINDINGS: Normal heart size. Aortic atherosclerosis and tortuosity. Coarse lung markings appear chronic. Minimal patchy left lung base opacity. There is biapical pleuroparenchymal scarring. No pulmonary edema, pleural effusion, or pneumothorax. No acute osseous abnormalities are seen. Surgical hardware in the lower cervical spine partially included. IMPRESSION: 1. Minimal patchy left lung base opacity may be atelectasis or pneumonia. 2. Chronic interstitial coarsening. Electronically Signed   By: Keith Rake M.D.   On: 04/21/2021 20:47        Scheduled Meds:  enoxaparin (LOVENOX) injection  40 mg Subcutaneous F47B   folic acid  1 mg Oral Daily   simvastatin  40 mg Oral q1800   vitamin B-12  1,000 mcg Oral Daily   Continuous Infusions:  ciprofloxacin     lactated ringers Stopped (04/22/21 1140)     LOS: 1 day    Time spent: 25 minutes    Sidney Ace, MD [facility] Pager [phone]  If 7PM-7AM, please contact night-coverage 04/22/2021, 2:41 PM

## 2021-04-22 NOTE — ED Notes (Signed)
Report given to Russell, RN.  

## 2021-04-23 DIAGNOSIS — N10 Acute tubulo-interstitial nephritis: Secondary | ICD-10-CM | POA: Diagnosis not present

## 2021-04-23 LAB — CBC WITH DIFFERENTIAL/PLATELET
Abs Immature Granulocytes: 0.08 10*3/uL — ABNORMAL HIGH (ref 0.00–0.07)
Basophils Absolute: 0 10*3/uL (ref 0.0–0.1)
Basophils Relative: 0 %
Eosinophils Absolute: 0 10*3/uL (ref 0.0–0.5)
Eosinophils Relative: 0 %
HCT: 27.8 % — ABNORMAL LOW (ref 39.0–52.0)
Hemoglobin: 9.9 g/dL — ABNORMAL LOW (ref 13.0–17.0)
Immature Granulocytes: 1 %
Lymphocytes Relative: 13 %
Lymphs Abs: 1.2 10*3/uL (ref 0.7–4.0)
MCH: 34.7 pg — ABNORMAL HIGH (ref 26.0–34.0)
MCHC: 35.6 g/dL (ref 30.0–36.0)
MCV: 97.5 fL (ref 80.0–100.0)
Monocytes Absolute: 0.7 10*3/uL (ref 0.1–1.0)
Monocytes Relative: 8 %
Neutro Abs: 7.3 10*3/uL (ref 1.7–7.7)
Neutrophils Relative %: 78 %
Platelets: 175 10*3/uL (ref 150–400)
RBC: 2.85 MIL/uL — ABNORMAL LOW (ref 4.22–5.81)
RDW: 13.7 % (ref 11.5–15.5)
WBC: 9.3 10*3/uL (ref 4.0–10.5)
nRBC: 0 % (ref 0.0–0.2)

## 2021-04-23 LAB — BASIC METABOLIC PANEL
Anion gap: 11 (ref 5–15)
BUN: 12 mg/dL (ref 8–23)
CO2: 22 mmol/L (ref 22–32)
Calcium: 7.8 mg/dL — ABNORMAL LOW (ref 8.9–10.3)
Chloride: 93 mmol/L — ABNORMAL LOW (ref 98–111)
Creatinine, Ser: 1.06 mg/dL (ref 0.61–1.24)
GFR, Estimated: >60 mL/min (ref >60)
Glucose, Bld: 88 mg/dL (ref 70–99)
Potassium: 3.5 mmol/L (ref 3.5–5.1)
Sodium: 126 mmol/L — ABNORMAL LOW (ref 135–145)

## 2021-04-23 LAB — CREATININE, SERUM
Creatinine, Ser: 1.05 mg/dL (ref 0.61–1.24)
GFR, Estimated: >60 mL/min (ref >60)

## 2021-04-23 NOTE — Care Management Important Message (Signed)
Important Message  Patient Details  Name: Edwin Martin MRN: [id number] Date of Birth: Mar 28, 1956   Medicare Important Message Given:  Yes     [name] 04/23/2021, 3:44 PM

## 2021-04-23 NOTE — Progress Notes (Signed)
PROGRESS NOTE    Edwin Martin  TDS:[id number] DOB: Jan 16, 1956 DOA: 04/21/2021 PCP: Rusty Aus, MD   Brief Narrative  65 y.o. male with medical history significant for chronic pain secondary to lumbar and cervical radiculopathy s/p spinal fusion on chronic opiates who presents to the ED with history of burning on urination and lower abdominal pain radiating to the back moderate to severe intensity nausea without vomiting and a fever of 102 at home.  He took Tylenol but symptoms persisted and today started feeling  very fatigued and decided to come in for evaluation.  He denies cough, chest pain or shortness of breath and denies diarrhea.  Radiographic evidence of right-sided pyelonephritis.  Endorses improvement in back pain since admission.  Does endorse migraines.  Reluctant to try Imitrex due to history of severe allergic reaction to other agents.   Assessment & Plan:   Principal Problem:   Acute pyelonephritis Active Problems:   Cervical radicular pain   Lumbar radiculopathy   Long term prescription opiate use   Sepsis ([place])  Acute right-sided pyelonephritis Severe sepsis secondary to above Sepsis criteria met with fever, tachycardia, leukocytosis, lactic acidosis 2.2 Source is urine CT imaging survey significant for right-sided pyelonephritis, no treatable fluid collection or abscess Patient was started on meropenem in ED due to severe penicillin allergy Unlikely that patient will have cross-reactivity to cephalosporins but he is unwilling to try due to prior experiences with penicillins Plan: Continue IV ciprofloxacin Continue supplemental fluids Multimodal pain control Monitor vitals and fever curve Follow cultures, urine culture with>100k colonies E. Coli Blood cultures no growth to date     Cervical radicular pain s/p fusion   Lumbar radiculopathy s/p fusion   Long term prescription opiate use Continue multimodal pain control Patient is on home regimen   DVT  prophylaxis: SQ Lovenox Code Status: Full Family Communication: None today, offered to call but patient declined Disposition Plan: Status is: Inpatient  Remains inpatient appropriate because:Inpatient level of care appropriate due to severity of illness  Dispo: The patient is from: Home              Anticipated d/c is to: Home              Patient currently is not medically stable to d/c.   Difficult to place patient No  Right-sided pyelonephritis and resultant sepsis.  Clinically improving.  Possible discharge in 24 hours     Level of care: Med-Surg  Consultants:  None  Procedures:  None  Antimicrobials:  Ciprofloxacin   Subjective: Patient seen and examined.  No acute distress.  Only complaint is headache.  Objective: Vitals:   04/23/21 0021 04/23/21 0513 04/23/21 0738 04/23/21 1140  BP: (!) 101/52 111/66 108/61 105/70  Pulse: 82 93 84 84  Resp: '16 16 15 16  ' Temp: 99 F (37.2 C) 99.3 F (37.4 C) 99.5 F (37.5 C) 99 F (37.2 C)  TempSrc:      SpO2: 91% 95% 93% 93%  Weight:      Height:        Intake/Output Summary (Last 24 hours) at 04/23/2021 1151 Last data filed at 04/23/2021 0740 Gross per 24 hour  Intake 400 ml  Output 1250 ml  Net -850 ml   Filed Weights   04/21/21 1635  Weight: 59 kg    Examination:  General exam: Mild distress due to migraine pain Respiratory system: Clear to auscultation. Respiratory effort normal. Cardiovascular system: S1-S2, regular rate and rhythm, no murmurs,  no pedal edema  gastrointestinal system: Abdomen is nondistended, soft and nontender. No organomegaly or masses felt. Normal bowel sounds heard. Central nervous system: Alert and oriented. No focal neurological deficits. Extremities: Pain on palpation of back.  Decreased range of motion Skin: No rashes, lesions or ulcers Psychiatry: Judgement and insight appear normal. Mood & affect appropriate.     Data Reviewed: I have personally reviewed following labs and  imaging studies  CBC: Recent Labs  Lab 04/21/21 1638 04/21/21 2212 04/22/21 0609 04/23/21 0759  WBC 17.3* 17.8* 19.3* 9.3  NEUTROABS  --   --   --  7.3  HGB 13.1 13.0 11.0* 9.9*  HCT 36.5* 37.6* 31.1* 27.8*  MCV 98.1 100.5* 99.0 97.5  PLT 286 311 246 759   Basic Metabolic Panel: Recent Labs  Lab 04/21/21 1638 04/21/21 2212 04/22/21 0609 04/23/21 0427 04/23/21 0759  NA 133*  --  130*  --  126*  K 3.9  --  4.4  --  3.5  CL 101  --  95*  --  93*  CO2 21*  --  24  --  22  GLUCOSE 123*  --  97  --  88  BUN 9  --  13  --  12  CREATININE 0.95 1.23 1.23 1.05 1.06  CALCIUM 9.1  --  7.9*  --  7.8*   GFR: Estimated Creatinine Clearance: 57.2 mL/min (by C-G formula based on SCr of 1.06 mg/dL). Liver Function Tests: Recent Labs  Lab 04/21/21 1638  AST 19  ALT 7  ALKPHOS 85  BILITOT 1.0  PROT 7.2  ALBUMIN 3.5   No results for input(s): LIPASE, AMYLASE in the last 168 hours. No results for input(s): AMMONIA in the last 168 hours. Coagulation Profile: Recent Labs  Lab 04/21/21 2032  INR 1.0   Cardiac Enzymes: No results for input(s): CKTOTAL, CKMB, CKMBINDEX, TROPONINI in the last 168 hours. BNP (last 3 results) No results for input(s): PROBNP in the last 8760 hours. HbA1C: No results for input(s): HGBA1C in the last 72 hours. CBG: No results for input(s): GLUCAP in the last 168 hours. Lipid Profile: No results for input(s): CHOL, HDL, LDLCALC, TRIG, CHOLHDL, LDLDIRECT in the last 72 hours. Thyroid Function Tests: No results for input(s): TSH, T4TOTAL, FREET4, T3FREE, THYROIDAB in the last 72 hours. Anemia Panel: No results for input(s): VITAMINB12, FOLATE, FERRITIN, TIBC, IRON, RETICCTPCT in the last 72 hours. Sepsis Labs: Recent Labs  Lab 04/21/21 1639 04/21/21 2025  LATICACIDVEN 2.2* 1.5    Recent Results (from the past 240 hour(s))  Urine Culture     Status: Abnormal (Preliminary result)   Collection Time: 04/21/21  4:38 PM   Specimen: In/Out Cath  Urine  Result Value Ref Range Status   Specimen Description   Final    IN/OUT CATH URINE Performed at [facility], [address] [place], [place] [address]    Special Requests   Final    NONE Performed at [facility], [address] [place], [place] [address]    Culture >=100,000 COLONIES/mL ESCHERICHIA COLI (A)  Final   Report Status PENDING  Incomplete  Chlamydia/NGC rt PCR ([place] only)     Status: None   Collection Time: 04/21/21  4:38 PM   Specimen: Urine  Result Value Ref Range Status   Specimen source GC/Chlam URINE, RANDOM  Final   Chlamydia Tr NOT DETECTED NOT DETECTED Final   N gonorrhoeae NOT DETECTED NOT DETECTED Final    Comment: (NOTE)  This CT/NG assay has not been evaluated in patients with a history of  hysterectomy. Performed at [facility], [address] [place], [place] [address]   Culture, blood (routine x 2)     Status: None (Preliminary result)   Collection Time: 04/21/21  4:46 PM   Specimen: BLOOD  Result Value Ref Range Status   Specimen Description BLOOD BLOOD RIGHT FOREARM  Final   Special Requests   Final    BOTTLES DRAWN AEROBIC AND ANAEROBIC Blood Culture adequate volume   Culture   Final    NO GROWTH < 24 HOURS Performed at [facility], [address] [place], [place] [address]    Report Status PENDING  Incomplete  Culture, blood (routine x 2)     Status: None (Preliminary result)   Collection Time: 04/21/21  8:26 PM   Specimen: BLOOD  Result Value Ref Range Status   Specimen Description BLOOD RIGHT ANTECUBITAL  Final   Special Requests   Final    BOTTLES DRAWN AEROBIC AND ANAEROBIC Blood Culture results may not be optimal due to an inadequate volume of blood received in culture bottles   Culture   Final    NO GROWTH < 12 HOURS Performed at [facility], [address], [place], [place] [address]    Report Status PENDING  Incomplete  Resp Panel by RT-PCR (Flu A&B, Covid)  Nasopharyngeal Swab     Status: None   Collection Time: 04/21/21  8:26 PM   Specimen: Nasopharyngeal Swab; Nasopharyngeal(NP) swabs in vial transport medium  Result Value Ref Range Status   SARS Coronavirus 2 by RT PCR NEGATIVE NEGATIVE Final    Comment: (NOTE) SARS-CoV-2 target nucleic acids are NOT DETECTED.  The SARS-CoV-2 RNA is generally detectable in upper respiratory specimens during the acute phase of infection. The lowest concentration of SARS-CoV-2 viral copies this assay can detect is 138 copies/mL. A negative result does not preclude SARS-Cov-2 infection and should not be used as the sole basis for treatment or other patient management decisions. A negative result may occur with  improper specimen collection/handling, submission of specimen other than nasopharyngeal swab, presence of viral mutation(s) within the areas targeted by this assay, and inadequate number of viral copies(<138 copies/mL). A negative result must be combined with clinical observations, patient history, and epidemiological information. The expected result is Negative.  Fact Sheet for Patients:  EntrepreneurPulse.com.au  Fact Sheet for Healthcare Providers:  IncredibleEmployment.be  This test is no t yet approved or cleared by the [place] FDA and  has been authorized for detection and/or diagnosis of SARS-CoV-2 by FDA under an Emergency Use Authorization (EUA). This EUA will remain  in effect (meaning this test can be used) for the duration of the COVID-19 declaration under Section 564(b)(1) of the Act, 21 U.S.C.section 360bbb-3(b)(1), unless the authorization is terminated  or revoked sooner.       Influenza A by PCR NEGATIVE NEGATIVE Final   Influenza B by PCR NEGATIVE NEGATIVE Final    Comment: (NOTE) The Xpert Xpress SARS-CoV-2/FLU/RSV plus assay is intended as an aid in the diagnosis of influenza from Nasopharyngeal swab specimens and should not be  used as a sole basis for treatment. Nasal washings and aspirates are unacceptable for Xpert Xpress SARS-CoV-2/FLU/RSV testing.  Fact Sheet for Patients: EntrepreneurPulse.com.au  Fact Sheet for Healthcare Providers: IncredibleEmployment.be  This test is not yet approved or cleared by the [place] FDA and has been authorized for detection and/or diagnosis of SARS-CoV-2 by  FDA under an Emergency Use Authorization (EUA). This EUA will remain in effect (meaning this test can be used) for the duration of the COVID-19 declaration under Section 564(b)(1) of the Act, 21 U.S.C. section 360bbb-3(b)(1), unless the authorization is terminated or revoked.  Performed at [facility], [address] [place], [place] [address]          Radiology Studies: CT ABDOMEN PELVIS W CONTRAST  Result Date: 04/21/2021 CLINICAL DATA:  65 year old male with abdominal pain. EXAM: CT ABDOMEN AND PELVIS WITH CONTRAST TECHNIQUE: Multidetector CT imaging of the abdomen and pelvis was performed using the standard protocol following bolus administration of intravenous contrast. CONTRAST:  162m OMNIPAQUE IOHEXOL 350 MG/ML SOLN COMPARISON:  CT abdomen pelvis dated 06/03/2016. FINDINGS: Lower chest: The visualized lung bases are clear. There is coronary vascular calcification. No intra-abdominal free air or free fluid. Hepatobiliary: No focal liver abnormality is seen. No gallstones, gallbladder wall thickening, or biliary dilatation. Pancreas: Several scattered foci of calcification in the head and uncinate process of the pancreas, likely sequela prior pancreatitis. No active inflammatory changes. No drainable fluid collection or pseudocyst. Spleen: Normal in size without focal abnormality. Adrenals/Urinary Tract: The adrenal glands are unremarkable. There is no hydronephrosis on either side. There is heterogeneous enhancement of the right renal parenchyma most consistent  with pyelonephritis. Correlation with urinalysis recommended. No drainable fluid collection/abscess. There is mild right perinephric stranding and haziness. The visualized ureters appear unremarkable. There is diffuse thickening of the bladder wall. Stomach/Bowel: There is no bowel obstruction or active inflammation. Metallic clips noted adjacent to the cecum. The appendix is not visualized and may be surgically absent. No inflammatory changes noted in the right lower quadrant. Vascular/Lymphatic: Moderate aortoiliac atherosclerotic disease. The IVC is unremarkable. No portal venous gas. There is no adenopathy. Reproductive: The prostate gland is mildly enlarged measuring 5.4 cm in transverse axial diameter. Other: Bilateral inguinal hernia repair mesh. Musculoskeletal: Degenerative changes of the spine. No acute osseous pathology. IMPRESSION: 1. Findings most consistent with right-sided pyelonephritis. Correlation with urinalysis recommended. No drainable fluid collection/abscess. 2. No bowel obstruction. 3. Aortic Atherosclerosis (ICD10-I70.0). Electronically Signed   By: AAnner CreteM.D.   On: 04/21/2021 21:08   DG Chest Port 1 View  Result Date: 04/21/2021 CLINICAL DATA:  Questionable sepsis. Evaluate for abnormality. Severe back pain and urinary retention. Fever. EXAM: PORTABLE CHEST 1 VIEW COMPARISON:  Chest radiograph and CT 06/03/2016 FINDINGS: Normal heart size. Aortic atherosclerosis and tortuosity. Coarse lung markings appear chronic. Minimal patchy left lung base opacity. There is biapical pleuroparenchymal scarring. No pulmonary edema, pleural effusion, or pneumothorax. No acute osseous abnormalities are seen. Surgical hardware in the lower cervical spine partially included. IMPRESSION: 1. Minimal patchy left lung base opacity may be atelectasis or pneumonia. 2. Chronic interstitial coarsening. Electronically Signed   By: MKeith RakeM.D.   On: 04/21/2021 20:47        Scheduled  Meds:  enoxaparin (LOVENOX) injection  40 mg Subcutaneous QI10V  folic acid  1 mg Oral Daily   simvastatin  40 mg Oral q1800   vitamin B-12  1,000 mcg Oral Daily   Continuous Infusions:  ciprofloxacin 400 mg (04/23/21 0342)     LOS: 2 days    Time spent: 25 minutes    SSidney Ace MD [facility] Pager [phone]  If 7PM-7AM, please contact night-coverage 04/23/2021, 11:51 AM

## 2021-04-24 DIAGNOSIS — N10 Acute tubulo-interstitial nephritis: Secondary | ICD-10-CM | POA: Diagnosis not present

## 2021-04-24 LAB — BLOOD CULTURE ID PANEL (REFLEXED) - BCID2

## 2021-04-24 LAB — URINE CULTURE: Culture: >=100000 — AB

## 2021-04-24 LAB — CBC WITH DIFFERENTIAL/PLATELET
Abs Immature Granulocytes: 0.04 10*3/uL (ref 0.00–0.07)
Basophils Absolute: 0 10*3/uL (ref 0.0–0.1)
Basophils Relative: 0 %
Eosinophils Absolute: 0 10*3/uL (ref 0.0–0.5)
Eosinophils Relative: 0 %
HCT: 28.7 % — ABNORMAL LOW (ref 39.0–52.0)
Hemoglobin: 10.4 g/dL — ABNORMAL LOW (ref 13.0–17.0)
Immature Granulocytes: 1 %
Lymphocytes Relative: 24 %
Lymphs Abs: 1.8 10*3/uL (ref 0.7–4.0)
MCH: 34.1 pg — ABNORMAL HIGH (ref 26.0–34.0)
MCHC: 36.2 g/dL — ABNORMAL HIGH (ref 30.0–36.0)
MCV: 94.1 fL (ref 80.0–100.0)
Monocytes Absolute: 0.9 10*3/uL (ref 0.1–1.0)
Monocytes Relative: 11 %
Neutro Abs: 4.8 10*3/uL (ref 1.7–7.7)
Neutrophils Relative %: 64 %
Platelets: 188 10*3/uL (ref 150–400)
RBC: 3.05 MIL/uL — ABNORMAL LOW (ref 4.22–5.81)
RDW: 13.5 % (ref 11.5–15.5)
WBC: 7.5 10*3/uL (ref 4.0–10.5)
nRBC: 0 % (ref 0.0–0.2)

## 2021-04-24 LAB — BASIC METABOLIC PANEL
Anion gap: 10 (ref 5–15)
BUN: 11 mg/dL (ref 8–23)
CO2: 27 mmol/L (ref 22–32)
Calcium: 8.4 mg/dL — ABNORMAL LOW (ref 8.9–10.3)
Chloride: 92 mmol/L — ABNORMAL LOW (ref 98–111)
Creatinine, Ser: 0.91 mg/dL (ref 0.61–1.24)
GFR, Estimated: >60 mL/min (ref >60)
Glucose, Bld: 92 mg/dL (ref 70–99)
Potassium: 3.7 mmol/L (ref 3.5–5.1)
Sodium: 129 mmol/L — ABNORMAL LOW (ref 135–145)

## 2021-04-24 MED ORDER — CIPROFLOXACIN HCL 500 MG PO TABS
500.0000 mg | ORAL_TABLET | Freq: Two times a day (BID) | ORAL | Status: DC
  Administered 2021-04-24: 10:00:00 500 mg via ORAL
  Filled 2021-04-24: qty 1

## 2021-04-24 MED ORDER — CIPROFLOXACIN HCL 500 MG PO TABS: 500.0000 mg | ORAL_TABLET | Freq: Two times a day (BID) | ORAL | 0 refills | Status: AC

## 2021-04-24 NOTE — Progress Notes (Signed)
Cab voucher home provided.   Alfonso Ramus, [place] [phone]

## 2021-04-24 NOTE — Progress Notes (Signed)
Patient being discharged to home, discharge paperwork given to patient and teaching provided. Patient verbalizes understanding of discharge paperwork. Patient informed that medications sent to pharmacy. All belongings accounted for and sent home with patient.

## 2021-04-24 NOTE — Progress Notes (Signed)
PHARMACY - PHYSICIAN COMMUNICATION CRITICAL VALUE ALERT - BLOOD CULTURE IDENTIFICATION (BCID)  Initial BCID results:  1 (anaerobic) bottle of 4 growing E. Coli, no resistance.  Due to severe PCN allergy, pt currently on Cipro 400 mg IV BID. Initial principal problem sepsis secondary to right-sided pyelonephritis.  Appears that pt is responding to Cipro.  Pt Vitals, WBC & Lactic Acid are all now WNL.  Name of physician Contacted: Doylene Canard, MD  Changes to prescribed antibiotics required: No changes at this time.  Otelia Sergeant, PharmD, MBA 04/24/2021 3:13 AM

## 2021-04-24 NOTE — Discharge Summary (Signed)
Physician Discharge Summary  Edwin Martin LPF:[id number] DOB: 03/19/1956 DOA: 04/21/2021  PCP: Rusty Aus, MD  Admit date: 04/21/2021 Discharge date: 04/24/2021  Admitted From: Home Disposition: Home  Recommendations for Outpatient Follow-up:  Follow up with PCP in 1-2 weeks   Home Health: No Equipment/Devices: None  Discharge Condition: Stable CODE STATUS: Full Diet recommendation: Regular  Brief/Interim Summary: 65 y.o. male with medical history significant for chronic pain secondary to lumbar and cervical radiculopathy s/p spinal fusion on chronic opiates who presents to the ED with history of burning on urination and lower abdominal pain radiating to the back moderate to severe intensity nausea without vomiting and a fever of 102 at home.  He took Tylenol but symptoms persisted and today started feeling  very fatigued and decided to come in for evaluation.  He denies cough, chest pain or shortness of breath and denies diarrhea.   Radiographic evidence of right-sided pyelonephritis.  Endorses improvement in back pain since admission.  Does endorse migraines.  Reluctant to try Imitrex due to history of severe allergic reaction to other agents.  Patient did have blood culture positive for E. coli.  Sensitivities resulted on the day of discharge.  Sensitive to fluoroquinolones.  Transition to p.o. ciprofloxacin given penicillin allergy.  Complete total 10-day course.  Stable for discharge home.   Discharge Diagnoses:  Principal Problem:   Acute pyelonephritis Active Problems:   Cervical radicular pain   Lumbar radiculopathy   Long term prescription opiate use   Sepsis ([place])  Acute right-sided pyelonephritis E. coli bacteremia Severe sepsis secondary to above Sepsis criteria met with fever, tachycardia, leukocytosis, lactic acidosis 2.2 Source is urine Subsequent bacteremia CT imaging survey significant for right-sided pyelonephritis, no treatable fluid collection or  abscess Patient was started on meropenem in ED due to severe penicillin allergy Unlikely that patient will have cross-reactivity to cephalosporins but he is unwilling to try due to prior experiences with penicillins Discharge plan: P.o. ciprofloxacin, total course 10 days Stable for discharge home Outpatient PCP follow-up     Cervical radicular pain s/p fusion   Lumbar radiculopathy s/p fusion   Long term prescription opiate use Continue multimodal pain control Patient is on home regimen  Discharge Instructions  Discharge Instructions     Diet - low sodium heart healthy   Complete by: As directed    Increase activity slowly   Complete by: As directed       Allergies as of 04/24/2021       Reactions   Codeine    Other reaction(s): Unknown   Lyrica [pregabalin] Other (See Comments)   Constipation , evil,  Extremely bad temper.   Nsaids Nausea Only   Penicillins Hives   Has patient had a PCN reaction causing immediate rash, facial/tongue/throat swelling, SOB or lightheadedness with hypotension: Yes Has patient had a PCN reaction causing severe rash involving mucus membranes or skin necrosis: No Has patient had a PCN reaction that required hospitalization No Has patient had a PCN reaction occurring within the last 10 years: No If all of the above answers are "NO", then may proceed with Cephalosporin use. Other reaction(s): Unknown   Prednisone Anxiety   Other reaction(s): Hallucination        Medication List     TAKE these medications    acetaminophen 325 MG tablet Commonly known as: TYLENOL Take 650 mg by mouth every 6 (six) hours as needed.   aspirin-acetaminophen-caffeine 532-992-42 MG tablet Commonly known as: EXCEDRIN MIGRAINE Take by mouth  every 6 (six) hours as needed for headache.   ciprofloxacin 500 MG tablet Commonly known as: CIPRO Take 1 tablet (500 mg total) by mouth 2 (two) times daily for 8 days.   folic acid 1 MG tablet Commonly known as:  FOLVITE Take 1 mg by mouth daily.   HYDROcodone-acetaminophen 5-325 MG tablet Commonly known as: NORCO/VICODIN Take 2 tablets by mouth 3 (three) times daily. What changed: Another medication with the same name was removed. Continue taking this medication, and follow the directions you see here.   simvastatin 40 MG tablet Commonly known as: ZOCOR Take 40 mg by mouth at bedtime.   Vitamin B12 3000 MCG Subl Place 1 lozenge under the tongue daily.        Allergies  Allergen Reactions   Codeine     Other reaction(s): Unknown   Lyrica [Pregabalin] Other (See Comments)    Constipation , evil,  Extremely bad temper.    Nsaids Nausea Only   Penicillins Hives    Has patient had a PCN reaction causing immediate rash, facial/tongue/throat swelling, SOB or lightheadedness with hypotension: Yes Has patient had a PCN reaction causing severe rash involving mucus membranes or skin necrosis: No Has patient had a PCN reaction that required hospitalization No Has patient had a PCN reaction occurring within the last 10 years: No If all of the above answers are "NO", then may proceed with Cephalosporin use. Other reaction(s): Unknown   Prednisone Anxiety    Other reaction(s): Hallucination    Consultations: None   Procedures/Studies: CT ABDOMEN PELVIS W CONTRAST  Result Date: 04/21/2021 CLINICAL DATA:  65 year old male with abdominal pain. EXAM: CT ABDOMEN AND PELVIS WITH CONTRAST TECHNIQUE: Multidetector CT imaging of the abdomen and pelvis was performed using the standard protocol following bolus administration of intravenous contrast. CONTRAST:  113m OMNIPAQUE IOHEXOL 350 MG/ML SOLN COMPARISON:  CT abdomen pelvis dated 06/03/2016. FINDINGS: Lower chest: The visualized lung bases are clear. There is coronary vascular calcification. No intra-abdominal free air or free fluid. Hepatobiliary: No focal liver abnormality is seen. No gallstones, gallbladder wall thickening, or biliary dilatation.  Pancreas: Several scattered foci of calcification in the head and uncinate process of the pancreas, likely sequela prior pancreatitis. No active inflammatory changes. No drainable fluid collection or pseudocyst. Spleen: Normal in size without focal abnormality. Adrenals/Urinary Tract: The adrenal glands are unremarkable. There is no hydronephrosis on either side. There is heterogeneous enhancement of the right renal parenchyma most consistent with pyelonephritis. Correlation with urinalysis recommended. No drainable fluid collection/abscess. There is mild right perinephric stranding and haziness. The visualized ureters appear unremarkable. There is diffuse thickening of the bladder wall. Stomach/Bowel: There is no bowel obstruction or active inflammation. Metallic clips noted adjacent to the cecum. The appendix is not visualized and may be surgically absent. No inflammatory changes noted in the right lower quadrant. Vascular/Lymphatic: Moderate aortoiliac atherosclerotic disease. The IVC is unremarkable. No portal venous gas. There is no adenopathy. Reproductive: The prostate gland is mildly enlarged measuring 5.4 cm in transverse axial diameter. Other: Bilateral inguinal hernia repair mesh. Musculoskeletal: Degenerative changes of the spine. No acute osseous pathology. IMPRESSION: 1. Findings most consistent with right-sided pyelonephritis. Correlation with urinalysis recommended. No drainable fluid collection/abscess. 2. No bowel obstruction. 3. Aortic Atherosclerosis (ICD10-I70.0). Electronically Signed   By: AAnner CreteM.D.   On: 04/21/2021 21:08   DG Chest Port 1 View  Result Date: 04/21/2021 CLINICAL DATA:  Questionable sepsis. Evaluate for abnormality. Severe back pain and urinary retention. Fever. EXAM:  PORTABLE CHEST 1 VIEW COMPARISON:  Chest radiograph and CT 06/03/2016 FINDINGS: Normal heart size. Aortic atherosclerosis and tortuosity. Coarse lung markings appear chronic. Minimal patchy left  lung base opacity. There is biapical pleuroparenchymal scarring. No pulmonary edema, pleural effusion, or pneumothorax. No acute osseous abnormalities are seen. Surgical hardware in the lower cervical spine partially included. IMPRESSION: 1. Minimal patchy left lung base opacity may be atelectasis or pneumonia. 2. Chronic interstitial coarsening. Electronically Signed   By: Keith Rake M.D.   On: 04/21/2021 20:47   (Echo, Carotid, EGD, Colonoscopy, ERCP)    Subjective: Seen and examined on the day of discharge.  Stable no distress.  Stable for discharge home.  Discharge Exam: Vitals:   04/24/21 0603 04/24/21 0840  BP: 122/74 112/68  Pulse: 77 66  Resp: 16 16  Temp: 97.8 F (36.6 C) 98.7 F (37.1 C)  SpO2: 92% 95%   Vitals:   04/23/21 1540 04/23/21 2128 04/24/21 0603 04/24/21 0840  BP: 131/78 131/72 122/74 112/68  Pulse: 81 80 77 66  Resp: '18 18 16 16  ' Temp: 99.8 F (37.7 C) 98.5 F (36.9 C) 97.8 F (36.6 C) 98.7 F (37.1 C)  TempSrc:  Oral Oral   SpO2: 94% 92% 92% 95%  Weight:      Height:        General: Pt is alert, awake, not in acute distress Cardiovascular: RRR, S1/S2 +, no rubs, no gallops Respiratory: CTA bilaterally, no wheezing, no rhonchi Abdominal: Soft, NT, ND, bowel sounds + Extremities: no edema, no cyanosis    The results of significant diagnostics from this hospitalization (including imaging, microbiology, ancillary and laboratory) are listed below for reference.     Microbiology: Recent Results (from the past 240 hour(s))  Urine Culture     Status: Abnormal   Collection Time: 04/21/21  4:38 PM   Specimen: In/Out Cath Urine  Result Value Ref Range Status   Specimen Description   Final    IN/OUT CATH URINE Performed at [facility], [address] [place], [place] [address]    Special Requests   Final    NONE Performed at [facility], [place], [place] [address]    Culture >=100,000 COLONIES/mL  ESCHERICHIA COLI (A)  Final   Report Status 04/24/2021 FINAL  Final   Organism ID, Bacteria ESCHERICHIA COLI (A)  Final      Susceptibility   Escherichia coli - MIC*    AMPICILLIN >=32 RESISTANT Resistant     CEFAZOLIN <=4 SENSITIVE Sensitive     CEFEPIME <=0.12 SENSITIVE Sensitive     CEFTRIAXONE <=0.25 SENSITIVE Sensitive     CIPROFLOXACIN <=0.25 SENSITIVE Sensitive     GENTAMICIN >=16 RESISTANT Resistant     IMIPENEM <=0.25 SENSITIVE Sensitive     NITROFURANTOIN <=16 SENSITIVE Sensitive     TRIMETH/SULFA >=320 RESISTANT Resistant     AMPICILLIN/SULBACTAM 16 INTERMEDIATE Intermediate     PIP/TAZO <=4 SENSITIVE Sensitive     * >=100,000 COLONIES/mL ESCHERICHIA COLI  Chlamydia/NGC rt PCR ([facility] only)     Status: None   Collection Time: 04/21/21  4:38 PM   Specimen: Urine  Result Value Ref Range Status   Specimen source GC/Chlam URINE, RANDOM  Final   Chlamydia Tr NOT DETECTED NOT DETECTED Final   N gonorrhoeae NOT DETECTED NOT DETECTED Final    Comment: (NOTE) This CT/NG assay has not been evaluated in patients with a history of  hysterectomy. Performed at [facility], [address] [place], [place] [address]   Culture, blood (routine x 2)     Status: None (Preliminary result)   Collection Time: 04/21/21  4:46 PM   Specimen: BLOOD  Result Value Ref Range Status   Specimen Description BLOOD BLOOD RIGHT FOREARM  Final   Special Requests   Final    BOTTLES DRAWN AEROBIC AND ANAEROBIC Blood Culture adequate volume   Culture  Setup Time   Final    GRAM NEGATIVE RODS Organism ID to follow ANAEROBIC BOTTLE ONLY CRITICAL RESULT CALLED TO, READ BACK BY AND VERIFIED WITH: NATHAN BELUE @ 0202 04/24/21 LFD Performed at [facility], [address] [place], [place] [address]    Culture GRAM NEGATIVE RODS  Final   Report Status PENDING  Incomplete  Blood Culture ID Panel (Reflexed)     Status: Abnormal   Collection Time: 04/21/21  4:46 PM  Result Value Ref  Range Status   Enterococcus faecalis NOT DETECTED NOT DETECTED Final   Enterococcus Faecium NOT DETECTED NOT DETECTED Final   Listeria monocytogenes NOT DETECTED NOT DETECTED Final   Staphylococcus species NOT DETECTED NOT DETECTED Final   Staphylococcus aureus (BCID) NOT DETECTED NOT DETECTED Final   Staphylococcus epidermidis NOT DETECTED NOT DETECTED Final   Staphylococcus lugdunensis NOT DETECTED NOT DETECTED Final   Streptococcus species NOT DETECTED NOT DETECTED Final   Streptococcus agalactiae NOT DETECTED NOT DETECTED Final   Streptococcus pneumoniae NOT DETECTED NOT DETECTED Final   Streptococcus pyogenes NOT DETECTED NOT DETECTED Final   A.calcoaceticus-baumannii NOT DETECTED NOT DETECTED Final   Bacteroides fragilis NOT DETECTED NOT DETECTED Final   Enterobacterales DETECTED (A) NOT DETECTED Final    Comment: Enterobacterales represent a large order of gram negative bacteria, not a single organism. CRITICAL RESULT CALLED TO, READ BACK BY AND VERIFIED WITH: NATHAN BELUE @ 0202 04/24/21 LFD    Enterobacter cloacae complex NOT DETECTED NOT DETECTED Final   Escherichia coli DETECTED (A) NOT DETECTED Final    Comment: CRITICAL RESULT CALLED TO, READ BACK BY AND VERIFIED WITH: NATHAN BELUE @ 0202 04/24/21 LFD    Klebsiella aerogenes NOT DETECTED NOT DETECTED Final   Klebsiella oxytoca NOT DETECTED NOT DETECTED Final   Klebsiella pneumoniae NOT DETECTED NOT DETECTED Final   Proteus species NOT DETECTED NOT DETECTED Final   Salmonella species NOT DETECTED NOT DETECTED Final   Serratia marcescens NOT DETECTED NOT DETECTED Final   Haemophilus influenzae NOT DETECTED NOT DETECTED Final   Neisseria meningitidis NOT DETECTED NOT DETECTED Final   Pseudomonas aeruginosa NOT DETECTED NOT DETECTED Final   Stenotrophomonas maltophilia NOT DETECTED NOT DETECTED Final   Candida albicans NOT DETECTED NOT DETECTED Final   Candida auris NOT DETECTED NOT DETECTED Final   Candida glabrata NOT  DETECTED NOT DETECTED Final   Candida krusei NOT DETECTED NOT DETECTED Final   Candida parapsilosis NOT DETECTED NOT DETECTED Final   Candida tropicalis NOT DETECTED NOT DETECTED Final   Cryptococcus neoformans/gattii NOT DETECTED NOT DETECTED Final   CTX-M ESBL NOT DETECTED NOT DETECTED Final   Carbapenem resistance IMP NOT DETECTED NOT DETECTED Final   Carbapenem resistance KPC NOT DETECTED NOT DETECTED Final   Carbapenem resistance NDM NOT DETECTED NOT DETECTED Final   Carbapenem resist OXA 48 LIKE NOT DETECTED NOT DETECTED Final   Carbapenem resistance VIM NOT DETECTED NOT DETECTED Final    Comment: Performed at [facility], [address] [place], [place] [address]  Culture, blood (routine x 2)     Status:  None (Preliminary result)   Collection Time: 04/21/21  8:26 PM   Specimen: BLOOD  Result Value Ref Range Status   Specimen Description BLOOD RIGHT ANTECUBITAL  Final   Special Requests   Final    BOTTLES DRAWN AEROBIC AND ANAEROBIC Blood Culture results may not be optimal due to an inadequate volume of blood received in culture bottles   Culture   Final    NO GROWTH 2 DAYS Performed at [facility], [address], [place], [place] [address]    Report Status PENDING  Incomplete  Resp Panel by RT-PCR (Flu A&B, Covid) Nasopharyngeal Swab     Status: None   Collection Time: 04/21/21  8:26 PM   Specimen: Nasopharyngeal Swab; Nasopharyngeal(NP) swabs in vial transport medium  Result Value Ref Range Status   SARS Coronavirus 2 by RT PCR NEGATIVE NEGATIVE Final    Comment: (NOTE) SARS-CoV-2 target nucleic acids are NOT DETECTED.  The SARS-CoV-2 RNA is generally detectable in upper respiratory specimens during the acute phase of infection. The lowest concentration of SARS-CoV-2 viral copies this assay can detect is 138 copies/mL. A negative result does not preclude SARS-Cov-2 infection and should not be used as the sole basis for treatment or other patient  management decisions. A negative result may occur with  improper specimen collection/handling, submission of specimen other than nasopharyngeal swab, presence of viral mutation(s) within the areas targeted by this assay, and inadequate number of viral copies(<138 copies/mL). A negative result must be combined with clinical observations, patient history, and epidemiological information. The expected result is Negative.  Fact Sheet for Patients:  EntrepreneurPulse.com.au  Fact Sheet for Healthcare Providers:  IncredibleEmployment.be  This test is no t yet approved or cleared by the [place] FDA and  has been authorized for detection and/or diagnosis of SARS-CoV-2 by FDA under an Emergency Use Authorization (EUA). This EUA will remain  in effect (meaning this test can be used) for the duration of the COVID-19 declaration under Section 564(b)(1) of the Act, 21 U.S.C.section 360bbb-3(b)(1), unless the authorization is terminated  or revoked sooner.       Influenza A by PCR NEGATIVE NEGATIVE Final   Influenza B by PCR NEGATIVE NEGATIVE Final    Comment: (NOTE) The Xpert Xpress SARS-CoV-2/FLU/RSV plus assay is intended as an aid in the diagnosis of influenza from Nasopharyngeal swab specimens and should not be used as a sole basis for treatment. Nasal washings and aspirates are unacceptable for Xpert Xpress SARS-CoV-2/FLU/RSV testing.  Fact Sheet for Patients: EntrepreneurPulse.com.au  Fact Sheet for Healthcare Providers: IncredibleEmployment.be  This test is not yet approved or cleared by the [place] FDA and has been authorized for detection and/or diagnosis of SARS-CoV-2 by FDA under an Emergency Use Authorization (EUA). This EUA will remain in effect (meaning this test can be used) for the duration of the COVID-19 declaration under Section 564(b)(1) of the Act, 21 U.S.C. section 360bbb-3(b)(1),  unless the authorization is terminated or revoked.  Performed at [facility], [address] [place], [place] [address]      Labs: BNP (last 3 results) No results for input(s): BNP in the last 8760 hours. Basic Metabolic Panel: Recent Labs  Lab 04/21/21 1638 04/21/21 2212 04/22/21 0609 04/23/21 0427 04/23/21 0759 04/24/21 0631  NA 133*  --  130*  --  126* 129*  K 3.9  --  4.4  --  3.5 3.7  CL 101  --  95*  --  93* 92*  CO2 21*  --  24  --  22 27  GLUCOSE 123*  --  97  --  88 92  BUN 9  --  13  --  12 11  CREATININE 0.95 1.23 1.23 1.05 1.06 0.91  CALCIUM 9.1  --  7.9*  --  7.8* 8.4*   Liver Function Tests: Recent Labs  Lab 04/21/21 1638  AST 19  ALT 7  ALKPHOS 85  BILITOT 1.0  PROT 7.2  ALBUMIN 3.5   No results for input(s): LIPASE, AMYLASE in the last 168 hours. No results for input(s): AMMONIA in the last 168 hours. CBC: Recent Labs  Lab 04/21/21 1638 04/21/21 2212 04/22/21 0609 04/23/21 0759 04/24/21 0631  WBC 17.3* 17.8* 19.3* 9.3 7.5  NEUTROABS  --   --   --  7.3 4.8  HGB 13.1 13.0 11.0* 9.9* 10.4*  HCT 36.5* 37.6* 31.1* 27.8* 28.7*  MCV 98.1 100.5* 99.0 97.5 94.1  PLT 286 311 246 175 188   Cardiac Enzymes: No results for input(s): CKTOTAL, CKMB, CKMBINDEX, TROPONINI in the last 168 hours. BNP: Invalid input(s): POCBNP CBG: No results for input(s): GLUCAP in the last 168 hours. D-Dimer No results for input(s): DDIMER in the last 72 hours. Hgb A1c No results for input(s): HGBA1C in the last 72 hours. Lipid Profile No results for input(s): CHOL, HDL, LDLCALC, TRIG, CHOLHDL, LDLDIRECT in the last 72 hours. Thyroid function studies No results for input(s): TSH, T4TOTAL, T3FREE, THYROIDAB in the last 72 hours.  Invalid input(s): FREET3 Anemia work up No results for input(s): VITAMINB12, FOLATE, FERRITIN, TIBC, IRON, RETICCTPCT in the last 72 hours. Urinalysis    Component Value Date/Time   COLORURINE YELLOW (A) 04/21/2021 1637    APPEARANCEUR HAZY (A) 04/21/2021 1637   APPEARANCEUR Cloudy 05/26/2013 1607   LABSPEC 1.010 04/21/2021 1637   LABSPEC 1.017 05/26/2013 1607   PHURINE 7.0 04/21/2021 1637   GLUCOSEU NEGATIVE 04/21/2021 1637   GLUCOSEU Negative 05/26/2013 1607   HGBUR SMALL (A) 04/21/2021 1637   BILIRUBINUR NEGATIVE 04/21/2021 1637   BILIRUBINUR Negative 05/26/2013 1607   KETONESUR NEGATIVE 04/21/2021 1637   PROTEINUR 100 (A) 04/21/2021 1637   NITRITE NEGATIVE 04/21/2021 1637   LEUKOCYTESUR LARGE (A) 04/21/2021 1637   LEUKOCYTESUR 3+ 05/26/2013 1607   Sepsis Labs Invalid input(s): PROCALCITONIN,  WBC,  LACTICIDVEN Microbiology Recent Results (from the past 240 hour(s))  Urine Culture     Status: Abnormal   Collection Time: 04/21/21  4:38 PM   Specimen: In/Out Cath Urine  Result Value Ref Range Status   Specimen Description   Final    IN/OUT CATH URINE Performed at [facility], [address], [place], [place] [address]    Special Requests   Final    NONE Performed at [facility], [address] [place], [place] [address]    Culture >=100,000 COLONIES/mL ESCHERICHIA COLI (A)  Final   Report Status 04/24/2021 FINAL  Final   Organism ID, Bacteria ESCHERICHIA COLI (A)  Final      Susceptibility   Escherichia coli - MIC*    AMPICILLIN >=32 RESISTANT Resistant     CEFAZOLIN <=4 SENSITIVE Sensitive     CEFEPIME <=0.12 SENSITIVE Sensitive     CEFTRIAXONE <=0.25 SENSITIVE Sensitive     CIPROFLOXACIN <=0.25 SENSITIVE Sensitive     GENTAMICIN >=16 RESISTANT Resistant     IMIPENEM <=0.25 SENSITIVE Sensitive     NITROFURANTOIN <=16 SENSITIVE Sensitive     TRIMETH/SULFA >=320 RESISTANT Resistant     AMPICILLIN/SULBACTAM 16 INTERMEDIATE  Intermediate     PIP/TAZO <=4 SENSITIVE Sensitive     * >=100,000 COLONIES/mL ESCHERICHIA COLI  Chlamydia/NGC rt PCR ([facility] only)     Status: None   Collection Time: 04/21/21  4:38 PM   Specimen: Urine  Result Value Ref Range Status    Specimen source GC/Chlam URINE, RANDOM  Final   Chlamydia Tr NOT DETECTED NOT DETECTED Final   N gonorrhoeae NOT DETECTED NOT DETECTED Final    Comment: (NOTE) This CT/NG assay has not been evaluated in patients with a history of  hysterectomy. Performed at [facility], [address] [place], [place] [address]   Culture, blood (routine x 2)     Status: None (Preliminary result)   Collection Time: 04/21/21  4:46 PM   Specimen: BLOOD  Result Value Ref Range Status   Specimen Description BLOOD BLOOD RIGHT FOREARM  Final   Special Requests   Final    BOTTLES DRAWN AEROBIC AND ANAEROBIC Blood Culture adequate volume   Culture  Setup Time   Final    GRAM NEGATIVE RODS Organism ID to follow ANAEROBIC BOTTLE ONLY CRITICAL RESULT CALLED TO, READ BACK BY AND VERIFIED WITH: NATHAN BELUE @ 0202 04/24/21 LFD Performed at [facility], [address] [place], [place] [address]    Culture GRAM NEGATIVE RODS  Final   Report Status PENDING  Incomplete  Blood Culture ID Panel (Reflexed)     Status: Abnormal   Collection Time: 04/21/21  4:46 PM  Result Value Ref Range Status   Enterococcus faecalis NOT DETECTED NOT DETECTED Final   Enterococcus Faecium NOT DETECTED NOT DETECTED Final   Listeria monocytogenes NOT DETECTED NOT DETECTED Final   Staphylococcus species NOT DETECTED NOT DETECTED Final   Staphylococcus aureus (BCID) NOT DETECTED NOT DETECTED Final   Staphylococcus epidermidis NOT DETECTED NOT DETECTED Final   Staphylococcus lugdunensis NOT DETECTED NOT DETECTED Final   Streptococcus species NOT DETECTED NOT DETECTED Final   Streptococcus agalactiae NOT DETECTED NOT DETECTED Final   Streptococcus pneumoniae NOT DETECTED NOT DETECTED Final   Streptococcus pyogenes NOT DETECTED NOT DETECTED Final   A.calcoaceticus-baumannii NOT DETECTED NOT DETECTED Final   Bacteroides fragilis NOT DETECTED NOT DETECTED Final   Enterobacterales DETECTED (A) NOT DETECTED Final     Comment: Enterobacterales represent a large order of gram negative bacteria, not a single organism. CRITICAL RESULT CALLED TO, READ BACK BY AND VERIFIED WITH: NATHAN BELUE @ 0202 04/24/21 LFD    Enterobacter cloacae complex NOT DETECTED NOT DETECTED Final   Escherichia coli DETECTED (A) NOT DETECTED Final    Comment: CRITICAL RESULT CALLED TO, READ BACK BY AND VERIFIED WITH: NATHAN BELUE @ 0202 04/24/21 LFD    Klebsiella aerogenes NOT DETECTED NOT DETECTED Final   Klebsiella oxytoca NOT DETECTED NOT DETECTED Final   Klebsiella pneumoniae NOT DETECTED NOT DETECTED Final   Proteus species NOT DETECTED NOT DETECTED Final   Salmonella species NOT DETECTED NOT DETECTED Final   Serratia marcescens NOT DETECTED NOT DETECTED Final   Haemophilus influenzae NOT DETECTED NOT DETECTED Final   Neisseria meningitidis NOT DETECTED NOT DETECTED Final   Pseudomonas aeruginosa NOT DETECTED NOT DETECTED Final   Stenotrophomonas maltophilia NOT DETECTED NOT DETECTED Final   Candida albicans NOT DETECTED NOT DETECTED Final   Candida auris NOT DETECTED NOT DETECTED Final   Candida glabrata NOT DETECTED NOT DETECTED Final   Candida krusei NOT DETECTED NOT DETECTED Final   Candida parapsilosis NOT DETECTED NOT DETECTED Final   Candida  tropicalis NOT DETECTED NOT DETECTED Final   Cryptococcus neoformans/gattii NOT DETECTED NOT DETECTED Final   CTX-M ESBL NOT DETECTED NOT DETECTED Final   Carbapenem resistance IMP NOT DETECTED NOT DETECTED Final   Carbapenem resistance KPC NOT DETECTED NOT DETECTED Final   Carbapenem resistance NDM NOT DETECTED NOT DETECTED Final   Carbapenem resist OXA 48 LIKE NOT DETECTED NOT DETECTED Final   Carbapenem resistance VIM NOT DETECTED NOT DETECTED Final    Comment: Performed at [facility], [address] [place], [place] [address]  Culture, blood (routine x 2)     Status: None (Preliminary result)   Collection Time: 04/21/21  8:26 PM   Specimen: BLOOD  Result  Value Ref Range Status   Specimen Description BLOOD RIGHT ANTECUBITAL  Final   Special Requests   Final    BOTTLES DRAWN AEROBIC AND ANAEROBIC Blood Culture results may not be optimal due to an inadequate volume of blood received in culture bottles   Culture   Final    NO GROWTH 2 DAYS Performed at [facility], [address], [place], [place] [address]    Report Status PENDING  Incomplete  Resp Panel by RT-PCR (Flu A&B, Covid) Nasopharyngeal Swab     Status: None   Collection Time: 04/21/21  8:26 PM   Specimen: Nasopharyngeal Swab; Nasopharyngeal(NP) swabs in vial transport medium  Result Value Ref Range Status   SARS Coronavirus 2 by RT PCR NEGATIVE NEGATIVE Final    Comment: (NOTE) SARS-CoV-2 target nucleic acids are NOT DETECTED.  The SARS-CoV-2 RNA is generally detectable in upper respiratory specimens during the acute phase of infection. The lowest concentration of SARS-CoV-2 viral copies this assay can detect is 138 copies/mL. A negative result does not preclude SARS-Cov-2 infection and should not be used as the sole basis for treatment or other patient management decisions. A negative result may occur with  improper specimen collection/handling, submission of specimen other than nasopharyngeal swab, presence of viral mutation(s) within the areas targeted by this assay, and inadequate number of viral copies(<138 copies/mL). A negative result must be combined with clinical observations, patient history, and epidemiological information. The expected result is Negative.  Fact Sheet for Patients:  EntrepreneurPulse.com.au  Fact Sheet for Healthcare Providers:  IncredibleEmployment.be  This test is no t yet approved or cleared by the [place] FDA and  has been authorized for detection and/or diagnosis of SARS-CoV-2 by FDA under an Emergency Use Authorization (EUA). This EUA will remain  in effect (meaning this test can be  used) for the duration of the COVID-19 declaration under Section 564(b)(1) of the Act, 21 U.S.C.section 360bbb-3(b)(1), unless the authorization is terminated  or revoked sooner.       Influenza A by PCR NEGATIVE NEGATIVE Final   Influenza B by PCR NEGATIVE NEGATIVE Final    Comment: (NOTE) The Xpert Xpress SARS-CoV-2/FLU/RSV plus assay is intended as an aid in the diagnosis of influenza from Nasopharyngeal swab specimens and should not be used as a sole basis for treatment. Nasal washings and aspirates are unacceptable for Xpert Xpress SARS-CoV-2/FLU/RSV testing.  Fact Sheet for Patients: EntrepreneurPulse.com.au  Fact Sheet for Healthcare Providers: IncredibleEmployment.be  This test is not yet approved or cleared by the [place] FDA and has been authorized for detection and/or diagnosis of SARS-CoV-2 by FDA under an Emergency Use Authorization (EUA). This EUA will remain in effect (meaning this test can be used) for the duration of the COVID-19 declaration under Section 564(b)(1) of the Act,  21 U.S.C. section 360bbb-3(b)(1), unless the authorization is terminated or revoked.  Performed at [facility], [address], [place], [place] [address]      Time coordinating discharge: Over 30 minutes  SIGNED:   Sidney Ace, MD  [facility] 04/24/2021, 11:00 AM Pager   If 7PM-7AM, please contact night-coverage

## 2021-04-26 LAB — CULTURE, BLOOD (ROUTINE X 2)
Culture: NO GROWTH
Special Requests: ADEQUATE

## 2021-10-01 ENCOUNTER — Encounter: Payer: Self-pay | Admitting: Urology

## 2021-10-01 ENCOUNTER — Other Ambulatory Visit: Payer: Self-pay

## 2021-10-01 ENCOUNTER — Ambulatory Visit (INDEPENDENT_AMBULATORY_CARE_PROVIDER_SITE_OTHER): Payer: Medicare Other | Admitting: Urology

## 2021-10-01 VITALS — BP 151/74 | HR 72 | Ht 68.0 in | Wt 136.0 lb

## 2021-10-01 DIAGNOSIS — R972 Elevated prostate specific antigen [PSA]: Secondary | ICD-10-CM

## 2021-10-01 DIAGNOSIS — R300 Dysuria: Secondary | ICD-10-CM | POA: Diagnosis not present

## 2021-10-01 DIAGNOSIS — N401 Enlarged prostate with lower urinary tract symptoms: Secondary | ICD-10-CM | POA: Diagnosis not present

## 2021-10-01 LAB — URINALYSIS, COMPLETE
Bilirubin, UA: NEGATIVE
Glucose, UA: NEGATIVE
Ketones, UA: NEGATIVE
Leukocytes,UA: NEGATIVE
Nitrite, UA: NEGATIVE
Protein,UA: NEGATIVE
RBC, UA: NEGATIVE
Specific Gravity, UA: 1.01 (ref 1.005–1.030)
Urobilinogen, Ur: 0.2 mg/dL (ref 0.2–1.0)
pH, UA: 7 (ref 5.0–7.5)

## 2021-10-01 LAB — MICROSCOPIC EXAMINATION
Bacteria, UA: NONE SEEN
Epithelial Cells (non renal): NONE SEEN /hpf (ref 0–10)

## 2021-10-01 LAB — BLADDER SCAN AMB NON-IMAGING: Scan Result: 5

## 2021-10-01 MED ORDER — TAMSULOSIN HCL 0.4 MG PO CAPS: 0.4000 mg | ORAL_CAPSULE | Freq: Every day | ORAL | 1 refills | Status: DC

## 2021-10-01 NOTE — Progress Notes (Signed)
10/01/2021 8:46 AM   Edwin Martin 17-Oct-1955 [id number]  Referring provider: Rusty Aus, MD [place] [facility] [place],  [place] [address]  Chief Complaint  Patient presents with   Elevated PSA    HPI: Edwin Martin is a 66 y.o. male referred for evaluation of an elevated PSA.  PSA 09/01/2021 was 4.19 Prior PSA 03/02/2021 was 3.71 and since 2019 has been in the 2.9 range Hospitalized July 2022 with sepsis and E. coli bacteremia.  CT abdomen pelvis showed radiographic findings felt consistent with pyelonephritis.  Prostate gland enlargement noted He was treated with 48 hours IV antibiotic in the hospital and discharged on a 10-day course of ciprofloxacin Presently with bothersome lower urinary tract symptoms including frequency, hesitancy, weak urinary stream.  He has mild lower abdominal discomfort and mild dysuria.  Bothersome LUTS for the past 6 months   PMH: Past Medical History:  Diagnosis Date   Arthritis    Hyperlipidemia     Surgical History: Past Surgical History:  Procedure Laterality Date   BACK SURGERY     L5   NECK SURGERY      Home Medications:  Allergies as of 10/01/2021       Reactions   Codeine    Other reaction(s): Unknown   Lyrica [pregabalin] Other (See Comments)   Constipation , evil,  Extremely bad temper.   Nsaids Nausea Only   Penicillins Hives   Has patient had a PCN reaction causing immediate rash, facial/tongue/throat swelling, SOB or lightheadedness with hypotension: Yes Has patient had a PCN reaction causing severe rash involving mucus membranes or skin necrosis: No Has patient had a PCN reaction that required hospitalization No Has patient had a PCN reaction occurring within the last 10 years: No If all of the above answers are "NO", then may proceed with Cephalosporin use. Other reaction(s): Unknown   Prednisone Anxiety   Other reaction(s): Hallucination        Medication List        Accurate  as of October 01, 2021  8:46 AM. If you have any questions, ask your nurse or doctor.          acetaminophen 325 MG tablet Commonly known as: TYLENOL Take 650 mg by mouth every 6 (six) hours as needed.   aspirin-acetaminophen-caffeine [id number] MG tablet Commonly known as: EXCEDRIN MIGRAINE Take by mouth every 6 (six) hours as needed for headache.   folic acid 1 MG tablet Commonly known as: FOLVITE Take 1 mg by mouth daily.   HYDROcodone-acetaminophen 5-325 MG tablet Commonly known as: NORCO/VICODIN Take 2 tablets by mouth 3 (three) times daily.   simvastatin 40 MG tablet Commonly known as: ZOCOR Take 40 mg by mouth at bedtime.   tamsulosin 0.4 MG Caps capsule Commonly known as: FLOMAX Take 1 capsule (0.4 mg total) by mouth daily. Started by: Abbie Sons, MD   Vitamin B12 3000 MCG Subl Place 1 lozenge under the tongue daily. What changed: Another medication with the same name was removed. Continue taking this medication, and follow the directions you see here. Changed by: Abbie Sons, MD        Allergies:  Allergies  Allergen Reactions   Codeine     Other reaction(s): Unknown   Lyrica [Pregabalin] Other (See Comments)    Constipation , evil,  Extremely bad temper.    Nsaids Nausea Only   Penicillins Hives    Has patient had a PCN reaction causing immediate rash,  facial/tongue/throat swelling, SOB or lightheadedness with hypotension: Yes Has patient had a PCN reaction causing severe rash involving mucus membranes or skin necrosis: No Has patient had a PCN reaction that required hospitalization No Has patient had a PCN reaction occurring within the last 10 years: No If all of the above answers are "NO", then may proceed with Cephalosporin use. Other reaction(s): Unknown   Prednisone Anxiety    Other reaction(s): Hallucination    Family History: Family History  Problem Relation Age of Onset   Asthma Mother    Asthma Sister     Social History:   reports that he has been smoking cigarettes. He has a 15.00 pack-year smoking history. He has never used smokeless tobacco. He reports that he does not drink alcohol. No history on file for drug use.   Physical Exam: BP (!) 151/74    Pulse 72    Ht 5\' 8"  (1.727 m)    Wt 136 lb (61.7 kg)    BMI 20.68 kg/m   Constitutional:  Alert and oriented, No acute distress. HEENT: [place] AT, moist mucus membranes.  Trachea midline, no masses. Cardiovascular: No clubbing, cyanosis, or edema. Respiratory: Normal respiratory effort, no increased work of breathing. GI: Abdomen is soft, nontender, nondistended, no abdominal masses GU: Prostate 50 g, smooth without nodules.  Consistency soft/boggy Skin: No rashes, bruises or suspicious lesions. Neurologic: Grossly intact, no focal deficits, moving all 4 extremities. Psychiatric: Normal mood and affect.  Laboratory Data:  Urinalysis Appearance-yellow clear Dipstick-negative Microscopy-negative  Pertinent Imaging: CT images 04/21/2021 were personally reviewed and interpreted.  Prostate volume calculated at 55 cc   Assessment & Plan:    1.  Elevated PSA Mild PSA elevation Relatively recent hospitalization for E. coli bacteremia.  We discussed that febrile UTIs in men are commonly due to prostate infection and this is the most likely cause of his mild PSA elevation 1 month follow-up repeat PSA  2.  BPH with LUTS Bladder scan 5 mL Rx tamsulosin 0.4 mg daily Follow-up 1 month symptom reassessment  3.  Dysuria UA today negative   Abbie Sons, MD  [facility] [address], [place], [place] [address] [phone]

## 2021-10-29 ENCOUNTER — Other Ambulatory Visit: Payer: Self-pay

## 2021-10-29 DIAGNOSIS — R972 Elevated prostate specific antigen [PSA]: Secondary | ICD-10-CM

## 2021-11-01 ENCOUNTER — Other Ambulatory Visit: Payer: Medicare Other

## 2021-11-01 ENCOUNTER — Other Ambulatory Visit: Payer: Self-pay

## 2021-11-01 DIAGNOSIS — R972 Elevated prostate specific antigen [PSA]: Secondary | ICD-10-CM

## 2021-11-02 LAB — PSA: Prostate Specific Ag, Serum: 4.8 ng/mL — ABNORMAL HIGH (ref 0.0–4.0)

## 2021-11-04 ENCOUNTER — Ambulatory Visit (INDEPENDENT_AMBULATORY_CARE_PROVIDER_SITE_OTHER): Payer: Medicare Other | Admitting: Urology

## 2021-11-04 ENCOUNTER — Other Ambulatory Visit: Payer: Self-pay

## 2021-11-04 ENCOUNTER — Encounter: Payer: Self-pay | Admitting: Urology

## 2021-11-04 ENCOUNTER — Other Ambulatory Visit: Payer: Self-pay | Admitting: *Deleted

## 2021-11-04 VITALS — BP 142/83 | HR 64 | Ht 68.0 in | Wt 136.0 lb

## 2021-11-04 DIAGNOSIS — R300 Dysuria: Secondary | ICD-10-CM | POA: Diagnosis not present

## 2021-11-04 DIAGNOSIS — R972 Elevated prostate specific antigen [PSA]: Secondary | ICD-10-CM | POA: Diagnosis not present

## 2021-11-04 DIAGNOSIS — N401 Enlarged prostate with lower urinary tract symptoms: Secondary | ICD-10-CM | POA: Diagnosis not present

## 2021-11-04 LAB — MICROSCOPIC EXAMINATION
Bacteria, UA: NONE SEEN
Epithelial Cells (non renal): NONE SEEN /hpf (ref 0–10)
RBC, Urine: NONE SEEN /hpf (ref 0–2)
WBC, UA: NONE SEEN /hpf (ref 0–5)

## 2021-11-04 LAB — URINALYSIS, COMPLETE
Bilirubin, UA: NEGATIVE
Glucose, UA: NEGATIVE
Ketones, UA: NEGATIVE
Leukocytes,UA: NEGATIVE
Nitrite, UA: NEGATIVE
Protein,UA: NEGATIVE
RBC, UA: NEGATIVE
Specific Gravity, UA: 1.01 (ref 1.005–1.030)
Urobilinogen, Ur: 0.2 mg/dL (ref 0.2–1.0)
pH, UA: 7 (ref 5.0–7.5)

## 2021-11-04 MED ORDER — ALFUZOSIN HCL ER 10 MG PO TB24: 10.0000 mg | ORAL_TABLET | Freq: Every day | ORAL | 0 refills | Status: DC

## 2021-11-04 MED ORDER — SILODOSIN 8 MG PO CAPS: 8.0000 mg | ORAL_CAPSULE | Freq: Every day | ORAL | 0 refills | Status: DC

## 2021-11-04 NOTE — Patient Instructions (Signed)
Prostate MRI Prep: ? ?1- No ejaculation 48 hours prior to exam ? ?2- No food or drink or caffeine 4 hours prior to exam ? ?3- Fleets enema needs to be done 4 hours prior to exam  ? ?4- Urinate just prior to exam  ?

## 2021-11-04 NOTE — Progress Notes (Signed)
11/04/2021 8:31 AM   Juvenal Florestine Avers Mar 28, 1956 [id number]  Referring provider: Rusty Aus, MD [place] [facility] [place],  [place] [address]  Chief Complaint  Patient presents with   Other    HPI: 66 y.o. male presents for follow-up.  Refer to my prior note 10/01/2021 He took tamsulosin for approximately 2 weeks then had to discontinue due to side effects of dysphagia which resolved after stopping the medication.  He saw no significant improvement in his voiding pattern while taking this medication Denies dysuria, gross hematuria No flank, abdominal or pelvic pain   PMH: Past Medical History:  Diagnosis Date   Arthritis    Hyperlipidemia     Surgical History: Past Surgical History:  Procedure Laterality Date   BACK SURGERY     L5   NECK SURGERY      Home Medications:  Allergies as of 11/04/2021       Reactions   Codeine    Other reaction(s): Unknown   Lyrica [pregabalin] Other (See Comments)   Constipation , evil,  Extremely bad temper.   Nsaids Nausea Only   Penicillins Hives   Has patient had a PCN reaction causing immediate rash, facial/tongue/throat swelling, SOB or lightheadedness with hypotension: Yes Has patient had a PCN reaction causing severe rash involving mucus membranes or skin necrosis: No Has patient had a PCN reaction that required hospitalization No Has patient had a PCN reaction occurring within the last 10 years: No If all of the above answers are "NO", then may proceed with Cephalosporin use. Other reaction(s): Unknown   Prednisone Anxiety   Other reaction(s): Hallucination        Medication List        Accurate as of November 04, 2021  8:31 AM. If you have any questions, ask your nurse or doctor.          acetaminophen 325 MG tablet Commonly known as: TYLENOL Take 650 mg by mouth every 6 (six) hours as needed.   aspirin-acetaminophen-caffeine [id number] MG tablet Commonly known as: EXCEDRIN  MIGRAINE Take by mouth every 6 (six) hours as needed for headache.   folic acid 1 MG tablet Commonly known as: FOLVITE Take 1 mg by mouth daily.   HYDROcodone-acetaminophen 5-325 MG tablet Commonly known as: NORCO/VICODIN Take 2 tablets by mouth 3 (three) times daily.   simvastatin 40 MG tablet Commonly known as: ZOCOR Take 40 mg by mouth at bedtime.   tamsulosin 0.4 MG Caps capsule Commonly known as: FLOMAX Take 1 capsule (0.4 mg total) by mouth daily.   Vitamin B12 3000 MCG Subl Place 1 lozenge under the tongue daily.        Allergies:  Allergies  Allergen Reactions   Codeine     Other reaction(s): Unknown   Lyrica [Pregabalin] Other (See Comments)    Constipation , evil,  Extremely bad temper.    Nsaids Nausea Only   Penicillins Hives    Has patient had a PCN reaction causing immediate rash, facial/tongue/throat swelling, SOB or lightheadedness with hypotension: Yes Has patient had a PCN reaction causing severe rash involving mucus membranes or skin necrosis: No Has patient had a PCN reaction that required hospitalization No Has patient had a PCN reaction occurring within the last 10 years: No If all of the above answers are "NO", then may proceed with Cephalosporin use. Other reaction(s): Unknown   Prednisone Anxiety    Other reaction(s): Hallucination    Family History: Family History  Problem Relation Age of Onset   Asthma Mother    Asthma Sister     Social History:  reports that he has been smoking cigarettes. He has a 15.00 pack-year smoking history. He has never used smokeless tobacco. He reports that he does not drink alcohol. No history on file for drug use.   Physical Exam: BP (!) 142/83    Pulse 64    Ht 5\' 8"  (1.727 m)    Wt 136 lb (61.7 kg)    BMI 20.68 kg/m   Constitutional:  Alert and oriented, No acute distress. HEENT: [place] AT, moist mucus membranes.  Trachea midline, no masses. Cardiovascular: No clubbing, cyanosis, or edema. Respiratory:  Normal respiratory effort, no increased work of breathing. Psychiatric: Normal mood and affect.  Laboratory Data:  Urinalysis Dipstick/microscopy negative  Assessment & Plan:    1.  BPH with LUTS Could not tolerate tamsulosin Trial silodosin 8 mg daily and instructed to call back regarding efficacy  2.  History UTI with sepsis UA today clear  3.  Elevated PSA Repeat PSA 11/01/2021 has increased to 4.8. Schedule prostate MRI to evaluate for evidence of high-grade disease   Abbie Sons, MD  [place] [address], [place], [place] [address] [phone]

## 2021-11-22 ENCOUNTER — Ambulatory Visit
Admission: RE | Admit: 2021-11-22 | Discharge: 2021-11-22 | Disposition: A | Discharge status: Home / Self Care | Payer: Medicare Other | Source: Ambulatory Visit | Attending: Urology | Admitting: Urology

## 2021-11-22 ENCOUNTER — Other Ambulatory Visit: Payer: Self-pay

## 2021-11-22 DIAGNOSIS — R972 Elevated prostate specific antigen [PSA]: Secondary | ICD-10-CM | POA: Diagnosis not present

## 2021-11-22 MED ORDER — GADOBUTROL 1 MMOL/ML IV SOLN
6.0000 mL | Freq: Once | INTRAVENOUS | Status: AC | PRN
  Administered 2021-11-22: 6 mL via INTRAVENOUS

## 2021-11-28 ENCOUNTER — Other Ambulatory Visit: Payer: Self-pay | Admitting: Urology

## 2021-12-03 ENCOUNTER — Telehealth: Payer: Self-pay | Admitting: *Deleted

## 2021-12-03 NOTE — Telephone Encounter (Signed)
-----   Message from Riki Altes, MD sent at 12/02/2021  4:43 PM EST ----- ?Please schedule a telephone encounter to discuss his MRI results ?

## 2021-12-03 NOTE — Telephone Encounter (Signed)
Left message for patient to call us back to scheduled an appt.  ?

## 2021-12-10 ENCOUNTER — Ambulatory Visit (INDEPENDENT_AMBULATORY_CARE_PROVIDER_SITE_OTHER): Payer: Medicare Other | Admitting: Urology

## 2021-12-10 ENCOUNTER — Other Ambulatory Visit: Payer: Self-pay

## 2021-12-10 ENCOUNTER — Encounter: Payer: Self-pay | Admitting: Urology

## 2021-12-10 VITALS — BP 127/70 | HR 63 | Ht 68.0 in | Wt 137.0 lb

## 2021-12-10 DIAGNOSIS — N401 Enlarged prostate with lower urinary tract symptoms: Secondary | ICD-10-CM | POA: Diagnosis not present

## 2021-12-10 DIAGNOSIS — R972 Elevated prostate specific antigen [PSA]: Secondary | ICD-10-CM

## 2021-12-10 LAB — MICROSCOPIC EXAMINATION: Bacteria, UA: NONE SEEN

## 2021-12-10 LAB — URINALYSIS, COMPLETE
Bilirubin, UA: NEGATIVE
Glucose, UA: NEGATIVE
Ketones, UA: NEGATIVE
Leukocytes,UA: NEGATIVE
Nitrite, UA: NEGATIVE
Protein,UA: NEGATIVE
RBC, UA: NEGATIVE
Specific Gravity, UA: 1.01 (ref 1.005–1.030)
Urobilinogen, Ur: 0.2 mg/dL (ref 0.2–1.0)
pH, UA: 6 (ref 5.0–7.5)

## 2021-12-10 LAB — BLADDER SCAN AMB NON-IMAGING: Scan Result: 9

## 2021-12-10 NOTE — Progress Notes (Signed)
? ?12/10/2021 ?8:06 AM  ? ?Edwin Martin ?05-Dec-1955 ?[id number] ? ?Referring provider: Danella Penton, MD ?[address] ?[facility] ?[place],  [place] [address] ? ?Chief Complaint  ?Patient presents with  ? Other  ? ? ?HPI: ?66 y.o. male with an elevated PSA who presents to discuss his prostate MRI results. ? ?PSA 11/01/2021 was 4.8 which had increased from a prior PSA 4.19 in September 2022 ?Prostate MRI performed 11/22/2021 remarkable for prostate volume of 25 cc and a PI-RADS 4 lesion right posterolateral PZ ?Still with lower urinary tract symptoms including hesitancy, weak stream, urgency and dysuria ?No significant improvement with alfuzosin; unable to tolerate tamsulosin ? ? ?PMH: ?Past Medical History:  ?Diagnosis Date  ? Arthritis   ? Hyperlipidemia   ? ? ?Surgical History: ?Past Surgical History:  ?Procedure Laterality Date  ? BACK SURGERY    ? L5  ? NECK SURGERY    ? ? ?Home Medications:  ?Allergies as of 12/10/2021   ? ?   Reactions  ? Codeine   ? Other reaction(s): Unknown  ? Lyrica [pregabalin] Other (See Comments)  ? Constipation , evil,  Extremely bad temper.  ? Nsaids Nausea Only  ? Penicillins Hives  ? Has patient had a PCN reaction causing immediate rash, facial/tongue/throat swelling, SOB or lightheadedness with hypotension: Yes ?Has patient had a PCN reaction causing severe rash involving mucus membranes or skin necrosis: No ?Has patient had a PCN reaction that required hospitalization No ?Has patient had a PCN reaction occurring within the last 10 years: No ?If all of the above answers are "NO", then may proceed with Cephalosporin use. ?Other reaction(s): Unknown  ? Prednisone Anxiety  ? Other reaction(s): Hallucination  ? ?  ? ?  ?Medication List  ?  ? ?  ? Accurate as of December 10, 2021  8:06 AM. If you have any questions, ask your nurse or doctor.  ?  ?  ? ?  ? ?acetaminophen 325 MG tablet ?Commonly known as: TYLENOL ?Take 650 mg by mouth every 6 (six) hours as needed. ?   ?alfuzosin 10 MG 24 hr tablet ?Commonly known as: UROXATRAL ?Take 1 tablet (10 mg total) by mouth daily with breakfast. ?  ?aspirin-acetaminophen-caffeine 250-250-65 MG tablet ?Commonly known as: EXCEDRIN MIGRAINE ?Take by mouth every 6 (six) hours as needed for headache. ?  ?folic acid 1 MG tablet ?Commonly known as: FOLVITE ?Take 1 mg by mouth daily. ?  ?HYDROcodone-acetaminophen 5-325 MG tablet ?Commonly known as: NORCO/VICODIN ?Take 2 tablets by mouth 3 (three) times daily. ?  ?simvastatin 40 MG tablet ?Commonly known as: ZOCOR ?Take 40 mg by mouth at bedtime. ?  ?Vitamin B12 3000 MCG Subl ?Place 1 lozenge under the tongue daily. ?  ? ?  ? ? ?Allergies:  ?Allergies  ?Allergen Reactions  ? Codeine   ?  Other reaction(s): Unknown  ? Lyrica [Pregabalin] Other (See Comments)  ?  Constipation , evil,  Extremely bad temper. ?  ? Nsaids Nausea Only  ? Penicillins Hives  ?  Has patient had a PCN reaction causing immediate rash, facial/tongue/throat swelling, SOB or lightheadedness with hypotension: Yes ?Has patient had a PCN reaction causing severe rash involving mucus membranes or skin necrosis: No ?Has patient had a PCN reaction that required hospitalization No ?Has patient had a PCN reaction occurring within the last 10 years: No ?If all of the above answers are "NO", then may proceed with Cephalosporin use. ?Other reaction(s): Unknown  ? Prednisone  Anxiety  ?  Other reaction(s): Hallucination  ? ? ?Family History: ?Family History  ?Problem Relation Age of Onset  ? Asthma Mother   ? Asthma Sister   ? ? ?Social History:  reports that he has been smoking cigarettes. He has a 15.00 pack-year smoking history. He has never used smokeless tobacco. He reports that he does not drink alcohol. No history on file for drug use. ? ? ?Physical Exam: ?BP 127/70   Pulse 63   Ht 5\' 8"  (1.727 m)   Wt 137 lb (62.1 kg)   BMI 20.83 kg/m?   ?Constitutional:  Alert and oriented, No acute distress. ?HEENT: [place] AT, moist mucus membranes.   Trachea midline, no masses. ?Cardiovascular: No clubbing, cyanosis, or edema. ?Respiratory: Normal respiratory effort, no increased work of breathing. ?Psychiatric: Normal mood and affect. ? ?Laboratory Data: ? ?Urinalysis ?Dipstick/microscopy negative ? ? ? ?Assessment & Plan:   ? ?1.  Elevated PSA ?PI-RADS 4 lesion on MRI.  We discussed that a PI-RADS 4 lesion is considered suspicious for high-grade prostate cancer.  Fusion biopsy was discussed and he has elected to proceed.  Discussed we are currently not performing fusion biopsies in [place] and utilize [facility] in [place] to perform.  He desires to schedule an referral was sent  ? ?2.  Lower urinary tract symptoms ?Obstructive and storage related voiding symptoms ?No improvement on alpha-blocker therapy ?UA today clear ?PVR 9 mL ?Discussed cystoscopy for further evaluation of his outlet.  He would like to think this over ? ? ?[place], MD ? ?[facility] ?[address] ?[place], [place] ?[phone] ?

## 2021-12-11 ENCOUNTER — Encounter: Payer: Self-pay | Admitting: Urology

## 2021-12-11 ENCOUNTER — Other Ambulatory Visit: Payer: Self-pay | Admitting: Urology

## 2022-03-11 ENCOUNTER — Other Ambulatory Visit: Payer: Self-pay | Admitting: Family Medicine

## 2022-03-11 MED ORDER — ALFUZOSIN HCL ER 10 MG PO TB24: ORAL_TABLET | ORAL | 1 refills | Status: AC

## 2022-07-30 IMAGING — MR MR PROSTATE WO/W CM
56 series · 56 of 56 positions shown · IV contrast (6ml Gadavist)
Comparison: CT pelvis 04/21/2021

CLINICAL DATA: Elevated PSA level of 4.8. Dysuria and discomfort
urinating.

EXAM:
MR PROSTATE WITHOUT AND WITH CONTRAST
TECHNIQUE: Multiplanar multisequence MRI images were obtained of the pelvis
centered about the prostate. Pre and post contrast images were
obtained.
CONTRAST:  6mL GADAVIST GADOBUTROL 1 MMOL/ML IV SOLN

[Series 3: ax in&out whole · axial · 6.0mm · 0.74mm/px · 1 of 35 slices shown (1 of 2)]
[im 1/35]
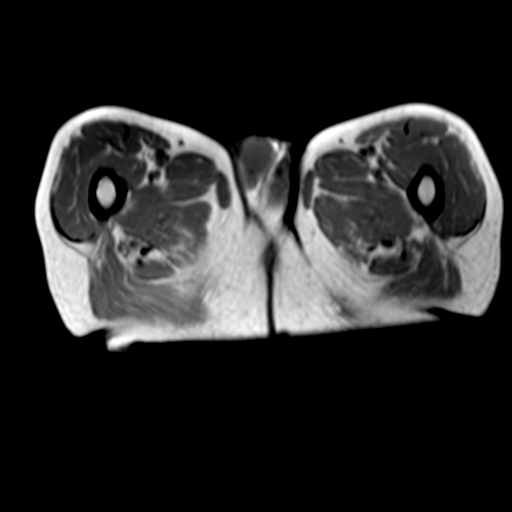

[Series 3: ax in&out whole · axial · 6.0mm · 0.74mm/px · 1 of 35 slices shown (2 of 2)]
[im 1/35]
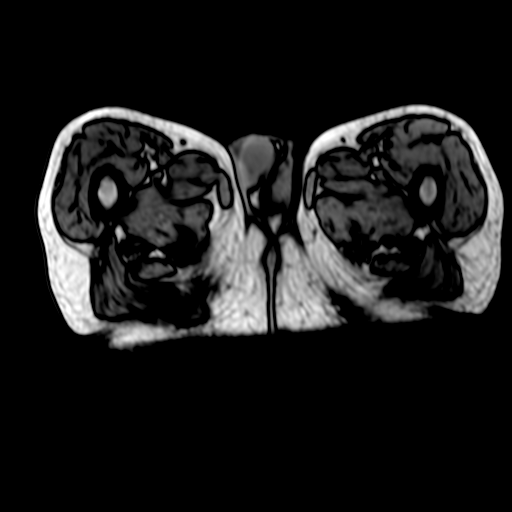

[Series 4: T2 · coronal · 3.0mm · 0.70mm/px · 1 of 37 slices shown (1 of 3)]
[im 1/37]
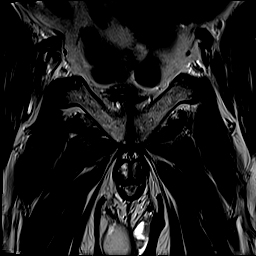

[Series 5: T2 · axial · 3.0mm · 0.56mm/px · 1 of 30 slices shown (2 of 3)]
[im 1/30]
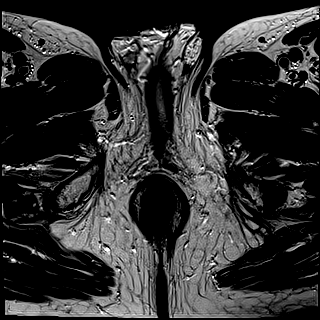

[Series 6: DWI · axial · 3.0mm · 0.86mm/px · 1 of 75 slices shown (1 of 3)]
[im 1/75]
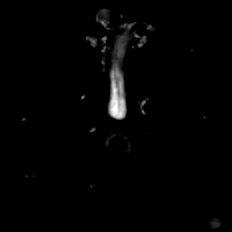

[Series 7: DWI · axial · 3.0mm · 0.86mm/px · 1 of 25 slices shown (2 of 3)]
[im 1/25]
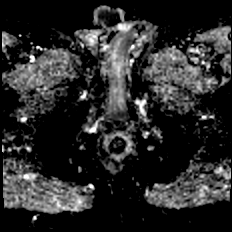

[Series 8: DWI · axial · 3.0mm · 0.86mm/px · 1 of 25 slices shown (3 of 3)]
[im 1/25]
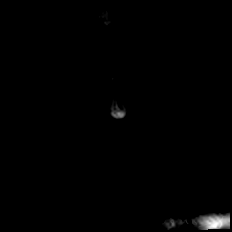

[Series 9: T2 · axial · 1.0mm · 1.04mm/px · 1 of 80 slices shown (3 of 3)]
[im 1/80]
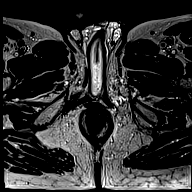

[Series 10: T1 · axial · 3.0mm · 1.15mm/px · 1 of 28 slices shown (1 of 48)]
[im 1/28]
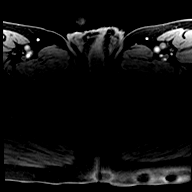

[Series 11: T1 · axial · 3.0mm · 1.15mm/px · 1 of 28 slices shown (2 of 48)]
[im 1/28]
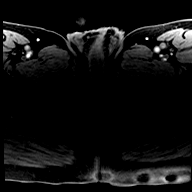

[Series 12: T1 · axial · 3.0mm · 1.15mm/px · 1 of 28 slices shown (3 of 48)]
[im 1/28]
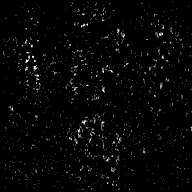

[Series 13: T1 · axial · 3.0mm · 1.15mm/px · 1 of 28 slices shown (4 of 48)]
[im 1/28]
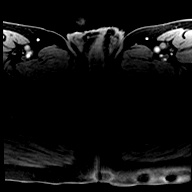

[Series 14: T1 · axial · 3.0mm · 1.15mm/px · 1 of 28 slices shown (5 of 48)]
[im 1/28]
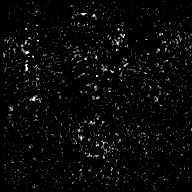

[Series 15: T1 · axial · 3.0mm · 1.15mm/px · 1 of 28 slices shown (6 of 48)]
[im 1/28]
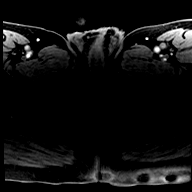

[Series 16: T1 · axial · 3.0mm · 1.15mm/px · 1 of 24 slices shown (7 of 48)]
[im 1/24]
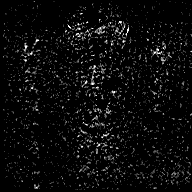

[Series 17: T1 · axial · 3.0mm · 1.15mm/px · 1 of 28 slices shown (8 of 48)]
[im 1/28]
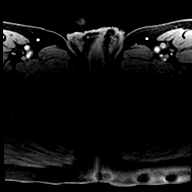

[Series 18: T1 · axial · 3.0mm · 1.15mm/px · 1 of 28 slices shown (9 of 48)]
[im 1/28]
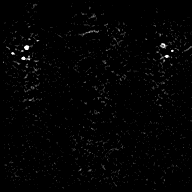

[Series 19: T1 · axial · 3.0mm · 1.15mm/px · 1 of 28 slices shown (10 of 48)]
[im 1/28]
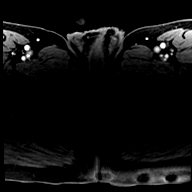

[Series 20: T1 · axial · 3.0mm · 1.15mm/px · 1 of 28 slices shown (11 of 48)]
[im 1/28]
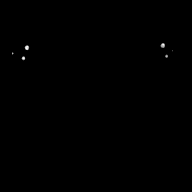

[Series 21: T1 · axial · 3.0mm · 1.15mm/px · 1 of 28 slices shown (12 of 48)]
[im 1/28]
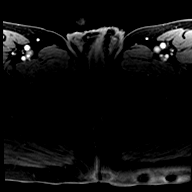

[Series 22: T1 · axial · 3.0mm · 1.15mm/px · 1 of 28 slices shown (13 of 48)]
[im 1/28]
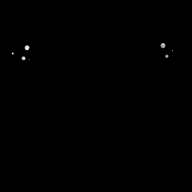

[Series 23: T1 · axial · 3.0mm · 1.15mm/px · 1 of 28 slices shown (14 of 48)]
[im 1/28]
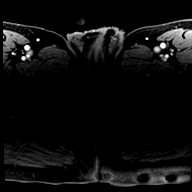

[Series 24: T1 · axial · 3.0mm · 1.15mm/px · 1 of 28 slices shown (15 of 48)]
[im 1/28]
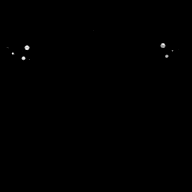

[Series 25: T1 · axial · 3.0mm · 1.15mm/px · 1 of 28 slices shown (16 of 48)]
[im 1/28]
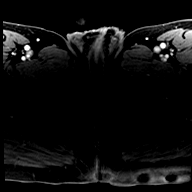

[Series 26: T1 · axial · 3.0mm · 1.15mm/px · 1 of 28 slices shown (17 of 48)]
[im 1/28]
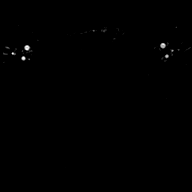

[Series 27: T1 · axial · 3.0mm · 1.15mm/px · 1 of 28 slices shown (18 of 48)]
[im 1/28]
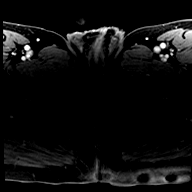

[Series 28: T1 · axial · 3.0mm · 1.15mm/px · 1 of 28 slices shown (19 of 48)]
[im 1/28]
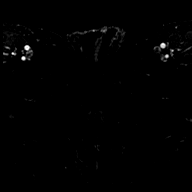

[Series 29: T1 · axial · 3.0mm · 1.15mm/px · 1 of 28 slices shown (20 of 48)]
[im 1/28]
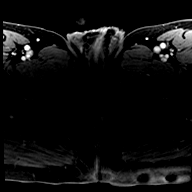

[Series 30: T1 · axial · 3.0mm · 1.15mm/px · 1 of 28 slices shown (21 of 48)]
[im 1/28]
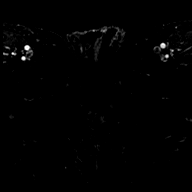

[Series 31: T1 · axial · 3.0mm · 1.15mm/px · 1 of 28 slices shown (22 of 48)]
[im 1/28]
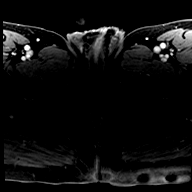

[Series 32: T1 · axial · 3.0mm · 1.15mm/px · 1 of 28 slices shown (23 of 48)]
[im 1/28]
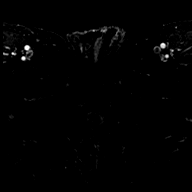

[Series 33: T1 · axial · 3.0mm · 1.15mm/px · 1 of 28 slices shown (24 of 48)]
[im 1/28]
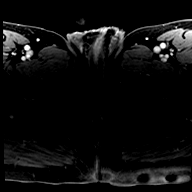

[Series 34: T1 · axial · 3.0mm · 1.15mm/px · 1 of 28 slices shown (25 of 48)]
[im 1/28]
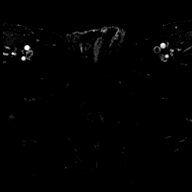

[Series 35: T1 · axial · 3.0mm · 1.15mm/px · 1 of 28 slices shown (26 of 48)]
[im 1/28]
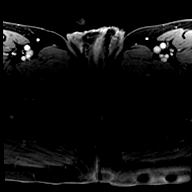

[Series 36: T1 · axial · 3.0mm · 1.15mm/px · 1 of 28 slices shown (27 of 48)]
[im 1/28]
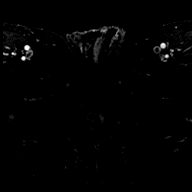

[Series 37: T1 · axial · 3.0mm · 1.15mm/px · 1 of 28 slices shown (28 of 48)]
[im 1/28]
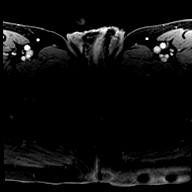

[Series 38: T1 · axial · 3.0mm · 1.15mm/px · 1 of 28 slices shown (29 of 48)]
[im 1/28]
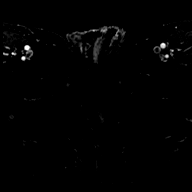

[Series 39: T1 · axial · 3.0mm · 1.15mm/px · 1 of 28 slices shown (30 of 48)]
[im 1/28]
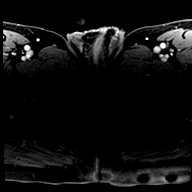

[Series 40: T1 · axial · 3.0mm · 1.15mm/px · 1 of 28 slices shown (31 of 48)]
[im 1/28]
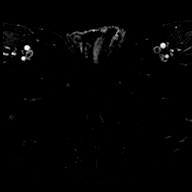

[Series 41: T1 · axial · 3.0mm · 1.15mm/px · 1 of 28 slices shown (32 of 48)]
[im 1/28]
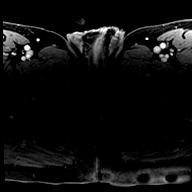

[Series 42: T1 · axial · 3.0mm · 1.15mm/px · 1 of 28 slices shown (33 of 48)]
[im 1/28]
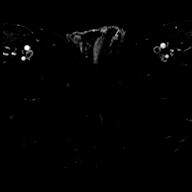

[Series 43: T1 · axial · 3.0mm · 1.15mm/px · 1 of 28 slices shown (34 of 48)]
[im 1/28]
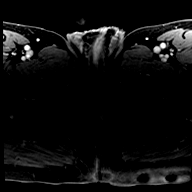

[Series 44: T1 · axial · 3.0mm · 1.15mm/px · 1 of 28 slices shown (35 of 48)]
[im 1/28]
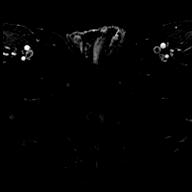

[Series 45: T1 · axial · 3.0mm · 1.15mm/px · 1 of 28 slices shown (36 of 48)]
[im 1/28]
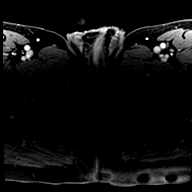

[Series 46: T1 · axial · 3.0mm · 1.15mm/px · 1 of 28 slices shown (37 of 48)]
[im 1/28]
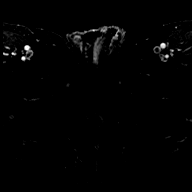

[Series 47: T1 · axial · 3.0mm · 1.15mm/px · 1 of 28 slices shown (38 of 48)]
[im 1/28]
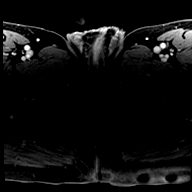

[Series 48: T1 · axial · 3.0mm · 1.15mm/px · 1 of 28 slices shown (39 of 48)]
[im 1/28]
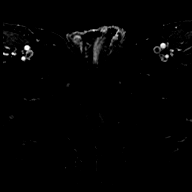

[Series 49: T1 · axial · 3.0mm · 1.15mm/px · 1 of 28 slices shown (40 of 48)]
[im 1/28]
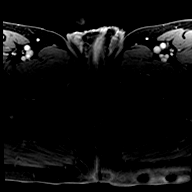

[Series 50: T1 · axial · 3.0mm · 1.15mm/px · 1 of 28 slices shown (41 of 48)]
[im 1/28]
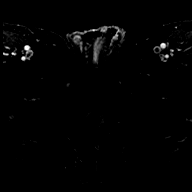

[Series 51: T1 · axial · 3.0mm · 1.15mm/px · 1 of 28 slices shown (42 of 48)]
[im 1/28]
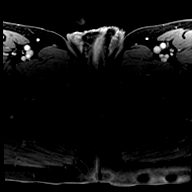

[Series 52: T1 · axial · 3.0mm · 1.15mm/px · 1 of 28 slices shown (43 of 48)]
[im 1/28]
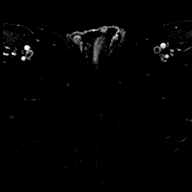

[Series 53: T1 · axial · 3.0mm · 1.15mm/px · 1 of 28 slices shown (44 of 48)]
[im 1/28]
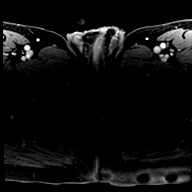

[Series 54: T1 · axial · 3.0mm · 1.15mm/px · 1 of 28 slices shown (45 of 48)]
[im 1/28]
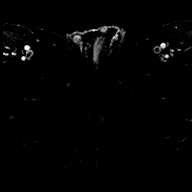

[Series 55: T1 · axial · 3.0mm · 1.15mm/px · 1 of 28 slices shown (46 of 48)]
[im 1/28]
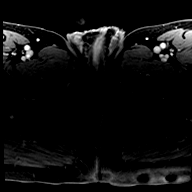

[Series 56: T1 · axial · 3.0mm · 1.15mm/px · 1 of 28 slices shown (47 of 48)]
[im 1/28]
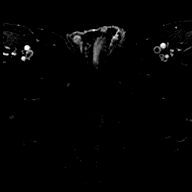

[Series 57: T1 · axial · 3.0mm · 1.15mm/px · 1 of 28 slices shown (48 of 48)]
[im 1/28]
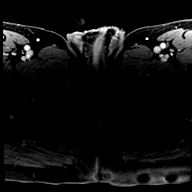

[56 of 56 positions shown; findings below may reference images not displayed]

FINDINGS: Prostate:

Hazy low T2 signal stranding is present diffusely throughout the
peripheral zone, probably postinflammatory and considered PI-RADS
category 2.

Region of interest # 1: Small PI-RADS category 4 lesion of the right
posterolateral peripheral zone in the subcapsular region with
focally reduced T2 signal (image 45, series 9), focal restriction of
diffusion (image 14, series 8), and focal reduced ADC map activity
(image 14, series 7). This measures 0.19 cc (1.0 by 0.4 by 0.6 cm).

Volume: 3D volumetric analysis: Prostate volume 24.68 cc (5.3 by
by 3.2 cm).

Transcapsular spread:  Absent

Seminal vesicle involvement: Absent

Neurovascular bundle involvement: Absent

Pelvic adenopathy: Absent

Bone metastasis: Absent

Other findings: Postoperative findings from prior bilateral groin
hernia repair. Aortoiliac atherosclerosis.
IMPRESSION: 1. Small subcapsular PI-RADS category 4 lesion of the right
posterolateral peripheral zone. Targeting data sent to [facility].
2.  Aortic Atherosclerosis ([id number]).
